# Patient Record
Sex: Female | Born: 1960 | Race: White | Hispanic: No | Marital: Married | Smoking: Never smoker
Health system: Southern US, Community
[De-identification: ages and names within clinical notes are randomized; demographics above are authoritative.]

## PROBLEM LIST (undated history)

## (undated) DIAGNOSIS — R51 Headache: Secondary | ICD-10-CM

## (undated) DIAGNOSIS — D649 Anemia, unspecified: Secondary | ICD-10-CM

## (undated) HISTORY — DX: Anemia, unspecified: D64.9

## (undated) HISTORY — PX: BREAST SURGERY: SHX581

## (undated) HISTORY — PX: WISDOM TOOTH EXTRACTION: SHX21

## (undated) HISTORY — PX: SHOULDER SURGERY: SHX246

## (undated) HISTORY — PX: DIAGNOSTIC LAPAROSCOPY: SUR761

## (undated) HISTORY — DX: Headache: R51

## (undated) HISTORY — PX: TONSILLECTOMY: SUR1361

---

## 2005-10-29 ENCOUNTER — Other Ambulatory Visit: Admission: RE | Admit: 2005-10-29 | Discharge: 2005-10-29 | Payer: Self-pay | Admitting: Obstetrics and Gynecology

## 2006-07-16 ENCOUNTER — Emergency Department (HOSPITAL_COMMUNITY): Admission: EM | Admit: 2006-07-16 | Discharge: 2006-07-16 | Payer: Self-pay | Admitting: Emergency Medicine

## 2006-07-21 ENCOUNTER — Ambulatory Visit: Payer: Self-pay | Admitting: Family Medicine

## 2006-08-11 ENCOUNTER — Ambulatory Visit: Payer: Self-pay | Admitting: Family Medicine

## 2006-09-25 ENCOUNTER — Ambulatory Visit: Payer: Self-pay | Admitting: Family Medicine

## 2006-10-08 ENCOUNTER — Ambulatory Visit: Payer: Self-pay | Admitting: Family Medicine

## 2006-11-21 ENCOUNTER — Other Ambulatory Visit: Admission: RE | Admit: 2006-11-21 | Discharge: 2006-11-21 | Payer: Self-pay | Admitting: Obstetrics and Gynecology

## 2006-12-03 DIAGNOSIS — R519 Headache, unspecified: Secondary | ICD-10-CM | POA: Insufficient documentation

## 2006-12-03 DIAGNOSIS — R51 Headache: Secondary | ICD-10-CM

## 2006-12-09 ENCOUNTER — Encounter: Admission: RE | Admit: 2006-12-09 | Discharge: 2006-12-09 | Payer: Self-pay | Admitting: Obstetrics and Gynecology

## 2007-01-09 ENCOUNTER — Ambulatory Visit: Payer: Self-pay | Admitting: Family Medicine

## 2007-01-09 DIAGNOSIS — D649 Anemia, unspecified: Secondary | ICD-10-CM

## 2007-01-09 DIAGNOSIS — H8110 Benign paroxysmal vertigo, unspecified ear: Secondary | ICD-10-CM | POA: Insufficient documentation

## 2007-12-07 ENCOUNTER — Ambulatory Visit (HOSPITAL_COMMUNITY): Admission: RE | Admit: 2007-12-07 | Discharge: 2007-12-07 | Payer: Self-pay | Admitting: Specialist

## 2007-12-23 ENCOUNTER — Encounter: Admission: RE | Admit: 2007-12-23 | Discharge: 2007-12-23 | Payer: Self-pay | Admitting: Family Medicine

## 2008-03-31 ENCOUNTER — Ambulatory Visit: Payer: Self-pay | Admitting: Family Medicine

## 2008-03-31 DIAGNOSIS — R5381 Other malaise: Secondary | ICD-10-CM

## 2008-03-31 DIAGNOSIS — R5383 Other fatigue: Secondary | ICD-10-CM

## 2008-03-31 DIAGNOSIS — R1013 Epigastric pain: Secondary | ICD-10-CM

## 2008-04-05 LAB — CONVERTED CEMR LAB
Albumin: 3.8 g/dL (ref 3.5–5.2)
BUN: 12 mg/dL (ref 6–23)
Calcium: 9.2 mg/dL (ref 8.4–10.5)
Cholesterol: 223 mg/dL (ref 0–200)
Direct LDL: 130 mg/dL
Eosinophils Absolute: 0.1 10*3/uL (ref 0.0–0.7)
Eosinophils Relative: 2.5 % (ref 0.0–5.0)
Ferritin: 4.3 ng/mL — ABNORMAL LOW (ref 10.0–291.0)
Folate: 11.8 ng/mL
GFR calc Af Amer: 114 mL/min
GFR calc non Af Amer: 95 mL/min
Glucose, Bld: 99 mg/dL (ref 70–99)
HCT: 33.7 % — ABNORMAL LOW (ref 36.0–46.0)
Hemoglobin: 11.1 g/dL — ABNORMAL LOW (ref 12.0–15.0)
MCV: 80 fL (ref 78.0–100.0)
Monocytes Absolute: 0.4 10*3/uL (ref 0.1–1.0)
Monocytes Relative: 10.8 % (ref 3.0–12.0)
Neutro Abs: 1.8 10*3/uL (ref 1.4–7.7)
Platelets: 320 10*3/uL (ref 150–400)
RDW: 14.2 % (ref 11.5–14.6)
Sodium: 141 meq/L (ref 135–145)
Total CHOL/HDL Ratio: 2.9
Total Protein: 6.9 g/dL (ref 6.0–8.3)
Triglycerides: 61 mg/dL (ref 0–149)
Vitamin B-12: 360 pg/mL (ref 211–911)

## 2009-01-23 ENCOUNTER — Encounter: Admission: RE | Admit: 2009-01-23 | Discharge: 2009-01-23 | Payer: Self-pay | Admitting: Obstetrics & Gynecology

## 2009-01-30 ENCOUNTER — Encounter: Admission: RE | Admit: 2009-01-30 | Discharge: 2009-01-30 | Payer: Self-pay | Admitting: Obstetrics & Gynecology

## 2009-02-21 ENCOUNTER — Ambulatory Visit: Payer: Self-pay | Admitting: Diagnostic Radiology

## 2009-02-21 ENCOUNTER — Emergency Department (HOSPITAL_BASED_OUTPATIENT_CLINIC_OR_DEPARTMENT_OTHER): Admission: EM | Admit: 2009-02-21 | Discharge: 2009-02-21 | Payer: Self-pay | Admitting: Emergency Medicine

## 2009-03-02 ENCOUNTER — Encounter (INDEPENDENT_AMBULATORY_CARE_PROVIDER_SITE_OTHER): Payer: Self-pay | Admitting: *Deleted

## 2009-04-15 HISTORY — PX: COLONOSCOPY: SHX174

## 2009-05-05 ENCOUNTER — Ambulatory Visit: Payer: Self-pay | Admitting: Family Medicine

## 2009-05-05 DIAGNOSIS — J019 Acute sinusitis, unspecified: Secondary | ICD-10-CM

## 2009-06-07 ENCOUNTER — Ambulatory Visit: Payer: Self-pay | Admitting: Family Medicine

## 2009-06-07 LAB — CONVERTED CEMR LAB
Nitrite: NEGATIVE
Protein, U semiquant: NEGATIVE
Urobilinogen, UA: 0.2
WBC Urine, dipstick: NEGATIVE

## 2009-06-08 LAB — CONVERTED CEMR LAB
ALT: 47 units/L — ABNORMAL HIGH (ref 0–35)
Alkaline Phosphatase: 57 units/L (ref 39–117)
Basophils Relative: 0.6 % (ref 0.0–3.0)
Bilirubin, Direct: 0 mg/dL (ref 0.0–0.3)
Chloride: 107 meq/L (ref 96–112)
Cholesterol: 201 mg/dL — ABNORMAL HIGH (ref 0–200)
Creatinine, Ser: 0.8 mg/dL (ref 0.4–1.2)
Direct LDL: 115.5 mg/dL
Eosinophils Relative: 2.8 % (ref 0.0–5.0)
Hemoglobin: 12.9 g/dL (ref 12.0–15.0)
MCV: 90.5 fL (ref 78.0–100.0)
Monocytes Absolute: 0.3 10*3/uL (ref 0.1–1.0)
Neutrophils Relative %: 47.7 % (ref 43.0–77.0)
Potassium: 4.6 meq/L (ref 3.5–5.1)
RBC: 4.38 M/uL (ref 3.87–5.11)
Sodium: 141 meq/L (ref 135–145)
Total CHOL/HDL Ratio: 2
Total Protein: 7 g/dL (ref 6.0–8.3)
VLDL: 14.6 mg/dL (ref 0.0–40.0)
WBC: 3.3 10*3/uL — ABNORMAL LOW (ref 4.5–10.5)

## 2009-06-09 ENCOUNTER — Telehealth (INDEPENDENT_AMBULATORY_CARE_PROVIDER_SITE_OTHER): Payer: Self-pay | Admitting: *Deleted

## 2009-06-13 ENCOUNTER — Ambulatory Visit: Payer: Self-pay | Admitting: Family Medicine

## 2009-06-19 ENCOUNTER — Encounter (INDEPENDENT_AMBULATORY_CARE_PROVIDER_SITE_OTHER): Payer: Self-pay | Admitting: *Deleted

## 2009-06-29 ENCOUNTER — Encounter (INDEPENDENT_AMBULATORY_CARE_PROVIDER_SITE_OTHER): Payer: Self-pay | Admitting: *Deleted

## 2009-07-03 ENCOUNTER — Ambulatory Visit: Payer: Self-pay | Admitting: Gastroenterology

## 2009-07-17 ENCOUNTER — Ambulatory Visit: Payer: Self-pay | Admitting: Gastroenterology

## 2009-08-01 ENCOUNTER — Encounter: Admission: RE | Admit: 2009-08-01 | Discharge: 2009-08-01 | Payer: Self-pay | Admitting: Obstetrics & Gynecology

## 2009-11-21 ENCOUNTER — Telehealth: Payer: Self-pay | Admitting: Family Medicine

## 2010-02-09 ENCOUNTER — Encounter: Admission: RE | Admit: 2010-02-09 | Discharge: 2010-02-09 | Payer: Self-pay | Admitting: Obstetrics and Gynecology

## 2010-05-06 ENCOUNTER — Encounter: Payer: Self-pay | Admitting: Obstetrics & Gynecology

## 2010-05-15 NOTE — Assessment & Plan Note (Signed)
Summary: head congestion//ccm   Vital Signs:  Patient profile:   50 year old female Weight:      139 pounds Temp:     97.7 degrees F oral BP sitting:   102 / 72  (left arm) Cuff size:   regular  Vitals Entered By: Alfred Levins, CMA (May 05, 2009 1:51 PM) CC: head congestion, fever, st, chills x2 wks   History of Present Illness: Here for one week of sinus pressure, HA, blowing green mucus from the nose, and a dry cough. No fever.   Current Medications (verified): 1)  Ferrous Fumarate 325 Mg Tabs (Ferrous Fumarate) .Marland Kitchen.. 1 By Mouth Two Times A Day  Allergies (verified): 1)  ! Sulfa  Past History:  Past Medical History: Reviewed history from 03/31/2008 and no changes required. Migraines Headache Anemia-NOS sees Dr. Ilda Mori  for GYN exams  Review of Systems  The patient denies anorexia, fever, weight loss, weight gain, vision loss, decreased hearing, hoarseness, chest pain, syncope, dyspnea on exertion, peripheral edema, hemoptysis, abdominal pain, melena, hematochezia, severe indigestion/heartburn, hematuria, incontinence, genital sores, muscle weakness, suspicious skin lesions, transient blindness, difficulty walking, depression, unusual weight change, abnormal bleeding, enlarged lymph nodes, angioedema, breast masses, and testicular masses.    Physical Exam  General:  Well-developed,well-nourished,in no acute distress; alert,appropriate and cooperative throughout examination Head:  Normocephalic and atraumatic without obvious abnormalities. No apparent alopecia or balding. Eyes:  No corneal or conjunctival inflammation noted. EOMI. Perrla. Funduscopic exam benign, without hemorrhages, exudates or papilledema. Vision grossly normal. Ears:  External ear exam shows no significant lesions or deformities.  Otoscopic examination reveals clear canals, tympanic membranes are intact bilaterally without bulging, retraction, inflammation or discharge. Hearing is grossly  normal bilaterally. Nose:  External nasal examination shows no deformity or inflammation. Nasal mucosa are pink and moist without lesions or exudates. Mouth:  Oral mucosa and oropharynx without lesions or exudates.  Teeth in good repair. Neck:  No deformities, masses, or tenderness noted. Lungs:  Normal respiratory effort, chest expands symmetrically. Lungs are clear to auscultation, no crackles or wheezes.   Impression & Recommendations:  Problem # 1:  ACUTE SINUSITIS, UNSPECIFIED (ICD-461.9)  Her updated medication list for this problem includes:    Zithromax Z-pak 250 Mg Tabs (Azithromycin) .Marland Kitchen... As directed  Complete Medication List: 1)  Ferrous Fumarate 325 Mg Tabs (Ferrous fumarate) .Marland Kitchen.. 1 by mouth two times a day 2)  Zithromax Z-pak 250 Mg Tabs (Azithromycin) .... As directed  Patient Instructions: 1)  Add Mucinex.  2)  Please schedule a follow-up appointment as needed .  Prescriptions: ZITHROMAX Z-PAK 250 MG TABS (AZITHROMYCIN) as directed  #1 x 0   Entered and Authorized by:   Nelwyn Salisbury MD   Signed by:   Nelwyn Salisbury MD on 05/05/2009   Method used:   Electronically to        [facility] [address] (retail)       [address]       [place], [place]  [address]       Ph: [phone] or [phone]       Fax: [phone]   RxID:   [id number]

## 2010-05-15 NOTE — Letter (Signed)
Summary: Moviprep Instructions  [facility]  [address]   [place], [place] [address]   Phone: [phone]  Fax: [phone]       Brittany Bartlett    1961/03/13    MRN: [id number]        Procedure Day /Date:07-17-09, Monday     Arrival Time: 10:00 a.m.      Procedure Time: 11:00 am.     Location of Procedure:                    x   [facility] (4th Floor)                        PREPARATION FOR COLONOSCOPY WITH MOVIPREP   Starting 5 days prior to your procedure 07-12-09 do not eat nuts, seeds, popcorn, corn, beans, peas,  salads, or any raw vegetables.  Do not take any fiber supplements (e.g. Metamucil, Citrucel, and Benefiber). THE DAY BEFORE YOUR PROCEDURE         DATE: 07-16-09    DAY: Sunday  1.  Drink clear liquids the entire day-NO SOLID FOOD  2.  Do not drink anything colored red or purple.  Avoid juices with pulp.  No orange juice.  3.  Drink at least 64 oz. (8 glasses) of fluid/clear liquids during the day to prevent dehydration and help the prep work efficiently.  CLEAR LIQUIDS INCLUDE: Water Jello Ice Popsicles Tea (sugar ok, no milk/cream) Powdered fruit flavored drinks Coffee (sugar ok, no milk/cream) Gatorade Juice: apple, white grape, white cranberry  Lemonade Clear bullion, consomm, broth Carbonated beverages (any kind) Strained chicken noodle soup Hard Candy                             4.  In the morning, mix first dose of MoviPrep solution:    Empty 1 Pouch A and 1 Pouch B into the disposable container    Add lukewarm drinking water to the top line of the container. Mix to dissolve    Refrigerate (mixed solution should be used within 24 hrs)  5.  Begin drinking the prep at 5:00 p.m. The MoviPrep container is divided by 4 marks.   Every 15 minutes drink the solution down to the next mark (approximately 8 oz) until the full liter is complete.   6.  Follow completed prep with 16 oz of clear liquid of your choice (Nothing  red or purple).  Continue to drink clear liquids until bedtime.  7.  Before going to bed, mix second dose of MoviPrep solution:    Empty 1 Pouch A and 1 Pouch B into the disposable container    Add lukewarm drinking water to the top line of the container. Mix to dissolve    Refrigerate  THE DAY OF YOUR PROCEDURE      DATE:  07-17-09  DAY: Monday  Beginning at 6:00 a.m. (5 hours before procedure):         1. Every 15 minutes, drink the solution down to the next mark (approx 8 oz) until the full liter is complete.  2. Follow completed prep with 16 oz. of clear liquid of your choice.    3. You may drink clear liquids until 9:00 a.m.  (2 HOURS BEFORE PROCEDURE).   MEDICATION INSTRUCTIONS  Unless otherwise instructed, you should take regular prescription medications with a small sip of water   as early as  possible the morning of your procedure.           OTHER INSTRUCTIONS  You will need a responsible adult at least 52 years of age to accompany you and drive you home.   This person must remain in the waiting room during your procedure.  Wear loose fitting clothing that is easily removed.  Leave jewelry and other valuables at home.  However, you may wish to bring a book to read or  an iPod/MP3 player to listen to music as you wait for your procedure to start.  Remove all body piercing jewelry and leave at home.  Total time from sign-in until discharge is approximately 2-3 hours.  You should go home directly after your procedure and rest.  You can resume normal activities the  day after your procedure.  The day of your procedure you should not:   Drive   Make legal decisions   Operate machinery   Drink alcohol   Return to work  You will receive specific instructions about eating, activities and medications before you leave.    The above instructions have been reviewed and explained to me by   Ezra Sites RN  July 03, 2009 1:52 PM    I fully understand and  can verbalize these instructions _____________________________ Date _________

## 2010-05-15 NOTE — Procedures (Signed)
Summary: Colonoscopy  Patient: [name] Note: All result statuses are Final unless otherwise noted.  Tests: (1) Colonoscopy (COL)   COL Colonoscopy           DONE     [facility]     [address]     [place], [place]  [address]           COLONOSCOPY PROCEDURE REPORT           PATIENT:  Brittany Bartlett, Brittany Bartlett  MR#:  [id number]     BIRTHDATE:  03/17/1961, 48 yrs. old  GENDER:  female     ENDOSCOPIST:  Vania Rea. Jarold Motto, MD, [facility]     REF. BY:  Tera Mater. Clent Ridges, M.D.     PROCEDURE DATE:  07/17/2009     PROCEDURE:  Average-risk screening colonoscopy     G0121     ASA CLASS:  Class I     INDICATIONS:  Iron Deficiency Anemia, Routine Risk Screening     MEDICATIONS:   Fentanyl 50 mcg IV, Versed 4 mg IV           DESCRIPTION OF PROCEDURE:   After the risks benefits and     alternatives of the procedure were thoroughly explained, informed     consent was obtained.  Digital rectal exam was performed and     revealed no abnormalities.   The LB CF-[id number] endoscope     was introduced through the anus and advanced to the cecum, which     was identified by both the appendix and ileocecal valve, without     limitations.  The quality of the prep was excellent, using     MoviPrep.  The instrument was then slowly withdrawn as the colon     was fully examined.     <<PROCEDUREIMAGES>>           FINDINGS:  No polyps or cancers were seen.  This was otherwise a     normal examination of the colon.   Retroflexed views in the rectum     revealed no abnormalities.    The scope was then withdrawn from     the patient and the procedure completed.           COMPLICATIONS:  None     ENDOSCOPIC IMPRESSION:     1) No polyps or cancers     2) Otherwise normal examination     RECOMMENDATIONS:     1) Continue current colorectal screening recommendations for     "routine risk" patients with a repeat colonoscopy in 10 years.     REPEAT EXAM:  No           ______________________________  Vania Rea. Jarold Motto, MD, [facility]           CC:           n.     eSIGNED:   Vania Rea. Naz Denunzio at 07/17/2009 12:07 PM           Nanetta Batty, [id number]  Note: An exclamation mark (!) indicates a result that was not dispersed into the flowsheet. Document Creation Date: 07/17/2009 12:08 PM _______________________________________________________________________  (1) Order result status: Final Collection or observation date-time: 07/17/2009 12:03 Requested date-time:  Receipt date-time:  Reported date-time:  Referring Physician:   Ordering Physician: Sheryn Bison [phone]) Specimen Source:  Source: Launa Grill Order Number: [id number] Lab site:   Appended Document: Colonoscopy    Clinical Lists Changes  Observations: Added new observation of COLONNXTDUE:  07/2019 (07/17/2009 12:34)     

## 2010-05-15 NOTE — Progress Notes (Signed)
Summary: ENT  Phone Note Call from Patient   Caller: Patient Call For: Nelwyn Salisbury MD Summary of Call: wants name of ENT you recommend for herself [phone] Initial call taken by: Raechel Ache, RN,  June 09, 2009 10:39 AM  Follow-up for Phone Call        try Dr. Serena Colonel at [facility] Follow-up by: Nelwyn Salisbury MD,  June 09, 2009 3:03 PM  Additional Follow-up for Phone Call Additional follow up Details #1::        Phone Call Completed Additional Follow-up by: Raechel Ache, RN,  June 09, 2009 3:48 PM

## 2010-05-15 NOTE — Progress Notes (Signed)
Summary: name ?  Phone Note Call from Patient Call back at Work Phone [phone]   Caller: Patient--live call Summary of Call: Need mame of a Gyn dr. Is it Waynard Reeds? Leave a message. Initial call taken by: Warnell Forester,  November 21, 2009 11:14 AM  Follow-up for Phone Call        yes I recommend Dr. Waynard Reeds Follow-up by: Nelwyn Salisbury MD,  November 21, 2009 12:00 PM  Additional Follow-up for Phone Call Additional follow up Details #1::        lmoam for the pt with info above. Additional Follow-up by: Warnell Forester,  November 21, 2009 1:54 PM

## 2010-05-15 NOTE — Assessment & Plan Note (Signed)
Summary: CPX/NO PAP/NJR   Vital Signs:  Patient profile:   50 year old female Height:      65.5 inches Weight:      138 pounds BMI:     22.70 Temp:     97.9 degrees F oral Pulse rate:   68 / minute BP sitting:   100 / 62  (left arm) Cuff size:   regular  Vitals Entered By: Alfred Levins, CMA (June 13, 2009 1:38 PM) CC: cpx, no pap   History of Present Illness: 50 yr old female  for cpx. She is doing well in general. She thinks she is starting menopause because her menses are very irregular  in the past 6 months. She is active and watches her diet.   Current Medications (verified): 1)  None  Allergies (verified): 1)  ! Sulfa  Past History:  Past Medical History: Reviewed history from 03/31/2008 and no changes required. Migraines Headache Anemia-NOS sees Dr. Ilda Mori  for GYN exams  Past Surgical History: right shoulder surgery to remove a bone spur 11-2007 per Dr. Shelle Iron  Family History: Reviewed history from 12/03/2006 and no changes required. Family Hsitory Headaches Family History High cholesterol Family History Hypertension Family History of Prostate CA 1st degree relative <50  Social History: Reviewed history from 12/03/2006 and no changes required. Occupation: Married Never Smoked Alcohol use-no Drug use-no Regular exercise-yes  Review of Systems  The patient denies anorexia, fever, weight loss, weight gain, vision loss, decreased hearing, hoarseness, chest pain, syncope, dyspnea on exertion, peripheral edema, prolonged cough, headaches, hemoptysis, abdominal pain, melena, hematochezia, severe indigestion/heartburn, hematuria, incontinence, genital sores, muscle weakness, suspicious skin lesions, transient blindness, difficulty walking, depression, unusual weight change, abnormal bleeding, enlarged lymph nodes, angioedema, breast masses, and testicular masses.    Physical Exam  General:  Well-developed,well-nourished,in no acute distress;  alert,appropriate and cooperative throughout examination Head:  Normocephalic and atraumatic without obvious abnormalities. No apparent alopecia or balding. Eyes:  No corneal or conjunctival inflammation noted. EOMI. Perrla. Funduscopic exam benign, without hemorrhages, exudates or papilledema. Vision grossly normal. Ears:  External ear exam shows no significant lesions or deformities.  Otoscopic examination reveals clear canals, tympanic membranes are intact bilaterally without bulging, retraction, inflammation or discharge. Hearing is grossly normal bilaterally. Nose:  External nasal examination shows no deformity or inflammation. Nasal mucosa are pink and moist without lesions or exudates. Mouth:  Oral mucosa and oropharynx without lesions or exudates.  Teeth in good repair. Neck:  No deformities, masses, or tenderness noted. Chest Wall:  No deformities, masses, or tenderness noted. Lungs:  Normal respiratory effort, chest expands symmetrically. Lungs are clear to auscultation, no crackles or wheezes. Heart:  Normal rate and regular rhythm. S1 and S2 normal without gallop, murmur, click, rub or other extra sounds. EKG normal. Abdomen:  Bowel sounds positive,abdomen soft and non-tender without masses, organomegaly or hernias noted. Msk:  No deformity or scoliosis noted of thoracic or lumbar spine.   Pulses:  R and L carotid,radial,femoral,dorsalis pedis and posterior tibial pulses are full and equal bilaterally Extremities:  No clubbing, cyanosis, edema, or deformity noted with normal full range of motion of all joints.   Neurologic:  No cranial nerve deficits noted. Station and gait are normal. Plantar reflexes are down-going bilaterally. DTRs are symmetrical throughout. Sensory, motor and coordinative functions appear intact. Skin:  Intact without suspicious lesions or rashes Cervical Nodes:  No lymphadenopathy noted Axillary Nodes:  No palpable lymphadenopathy Inguinal Nodes:  No significant  adenopathy Psych:  Cognition and judgment appear intact. Alert and cooperative with normal attention span and concentration. No apparent delusions, illusions, hallucinations   Impression & Recommendations:  Problem # 1:  HEALTH MAINTENANCE EXAM (ICD-V70.0)  Orders: EKG w/ Interpretation (93[address]) Gastroenterology Referral (GI)  Complete Medication List: 1)  Ferrous Sulfate 325 (65 Fe) Mg Tabs (Ferrous sulfate) .... Once daily  Patient Instructions: 1)  Schedule a colonoscopy/ sigmoidoscopy to help detect colon cancer.   Preventive Care Screening  Mammogram:    Date:  01/13/2009    Results:  normal   Pap Smear:    Date:  01/13/2009    Results:  normal

## 2010-05-15 NOTE — Miscellaneous (Signed)
Summary: [facility]  Clinical Lists Changes  Medications: Added new medication of MOVIPREP 100 GM  SOLR (PEG-KCL-NACL-NASULF-NA ASC-C) As per prep instructions. - Signed Rx of MOVIPREP 100 GM  SOLR (PEG-KCL-NACL-NASULF-NA ASC-C) As per prep instructions.;  #1 x 0;  Signed;  Entered by: Ezra Sites RN;  Authorized by: Mardella Layman MD [facility];  Method used: Electronically to [facility] [address] [place], [place], [place]  [address], Ph: [phone] or [phone], Fax: [phone] Allergies: Changed allergy or adverse reaction from SULFA to SULFA    Prescriptions: MOVIPREP 100 GM  SOLR (PEG-KCL-NACL-NASULF-NA ASC-C) As per prep instructions.  #1 x 0   Entered by:   Ezra Sites RN   Authorized by:   Mardella Layman MD [facility]   Signed by:   Ezra Sites RN on 07/03/2009   Method used:   Electronically to        [facility] [address] (retail)       [address]       [place], [place]  [address]       Ph: [phone] or [phone]       Fax: [phone]   RxID:   [id number]

## 2010-05-15 NOTE — Letter (Signed)
Summary: Previsit letter  [facility]  [address] [place], [place] [address]   Phone: [phone]  Fax: [phone]       06/19/2009 MRN: [id number]  Brittany Bartlett [address] [place], [place]  [address]  Dear [name],  Welcome to the Gastroenterology Division at [facility].    You are scheduled to see a nurse for your pre-procedure visit on 07/03/2009 at 1:00PM on the 3rd floor at [facility], [address]  We ask that you try to arrive at our office 15 minutes prior to your appointment time to allow for check-in.  Your nurse visit will consist of discussing your medical and surgical history, your immediate family medical history, and your medications.    Please bring a complete list of all your medications or, if you prefer, bring the medication bottles and we will list them.  We will need to be aware of both prescribed and over the counter drugs.  We will need to know exact dosage information as well.  If you are on blood thinners (Coumadin, Plavix, Aggrenox, Ticlid, etc.) please call our office today/prior to your appointment, as we need to consult with your physician about holding your medication.   Please be prepared to read and sign documents such as consent forms, a financial agreement, and acknowledgement forms.  If necessary, and with your consent, a friend or relative is welcome to sit-in on the nurse visit with you.  Please bring your insurance card so that we may make a copy of it.  If your insurance requires a referral to see a specialist, please bring your referral form from your primary care physician.  No co-pay is required for this nurse visit.     If you cannot keep your appointment, please call [phone] to cancel or reschedule prior to your appointment date.  This allows [place] the opportunity to schedule an appointment for another patient in need of care.    Thank you for choosing [facility] for your medical needs.   We appreciate the opportunity to care for you.  Please visit [place] at our website  to learn more about our practice.                     Sincerely.                                                                                                                   The Gastroenterology Division

## 2010-05-16 ENCOUNTER — Encounter: Payer: Self-pay | Admitting: Family Medicine

## 2010-05-17 ENCOUNTER — Encounter: Payer: Self-pay | Admitting: Family Medicine

## 2010-05-17 ENCOUNTER — Ambulatory Visit (INDEPENDENT_AMBULATORY_CARE_PROVIDER_SITE_OTHER): Payer: BC Managed Care – HMO | Admitting: Family Medicine

## 2010-05-17 VITALS — BP 110/80 | HR 84 | Temp 98.3°F | Wt 140.0 lb

## 2010-05-17 DIAGNOSIS — R002 Palpitations: Secondary | ICD-10-CM

## 2010-05-17 DIAGNOSIS — R238 Other skin changes: Secondary | ICD-10-CM

## 2010-05-17 DIAGNOSIS — E785 Hyperlipidemia, unspecified: Secondary | ICD-10-CM

## 2010-05-17 LAB — CBC WITH DIFFERENTIAL/PLATELET
Basophils Relative: 0.6 % (ref 0.0–3.0)
Eosinophils Relative: 0.6 % (ref 0.0–5.0)
HCT: 37.5 % (ref 36.0–46.0)
Lymphs Abs: 1.3 10*3/uL (ref 0.7–4.0)
MCV: 87.6 fl (ref 78.0–100.0)
Monocytes Absolute: 0.3 10*3/uL (ref 0.1–1.0)
RBC: 4.29 Mil/uL (ref 3.87–5.11)
WBC: 4.5 10*3/uL (ref 4.5–10.5)

## 2010-05-17 LAB — HEPATIC FUNCTION PANEL
ALT: 28 U/L (ref 0–35)
Alkaline Phosphatase: 61 U/L (ref 39–117)
Bilirubin, Direct: 0.1 mg/dL (ref 0.0–0.3)
Total Protein: 7.1 g/dL (ref 6.0–8.3)

## 2010-05-17 LAB — LIPID PANEL: VLDL: 12.8 mg/dL (ref 0.0–40.0)

## 2010-05-17 LAB — BASIC METABOLIC PANEL
BUN: 11 mg/dL (ref 6–23)
Calcium: 9.2 mg/dL (ref 8.4–10.5)
GFR: 86.36 mL/min (ref >60.00)
Glucose, Bld: 93 mg/dL (ref 70–99)

## 2010-05-17 LAB — LDL CHOLESTEROL, DIRECT: Direct LDL: 124.1 mg/dL

## 2010-05-17 LAB — PROTIME-INR: Prothrombin Time: 11.5 s (ref 10.2–12.4)

## 2010-05-17 NOTE — Progress Notes (Signed)
Addended by: Rossie Muskrat on: 05/17/2010 11:06 AM   Modules accepted: Orders

## 2010-05-17 NOTE — Patient Instructions (Addendum)
We will contact you about the Holter monitor

## 2010-05-17 NOTE — Progress Notes (Signed)
  Subjective:    Patient ID: Brittany Bartlett, female    DOB: [birth year], 50 y.o.   MRN: [id number]  HPI Here with several issues. First, she has noticed easy bruising on her skin for 6 months or so. She even relates how she had an open wound with bleeding on the right forearm 3 weeks ago after she simply brushed against a cardboard box. Also, she describes an odd event that occurs every time she exercises. She wears a heart monitor at the gym when she works out, and when walking on the treadmill she gets her heart rate up to 160 or so. Then when she changes to another piece of equipment, her HR suddenly drops to 60 or 70 with no slow down period. She feels nothing when this happens. Lastly she describes occasional episodes where she feels her heart beat hard twice ( "like a thump thump"), then she feels as if her BP drops suddenly and she feels a little weak. This is not related to exertion, and it lasts only a few seconds. No chest pain or SOB or sweats or nausea.    Review of Systems  Constitutional: Negative.   Respiratory: Negative.  Negative for chest tightness and shortness of breath.   Cardiovascular: Positive for palpitations. Negative for chest pain and leg swelling.  Gastrointestinal: Negative.        Objective:   Physical Exam  Constitutional: She appears well-developed and well-nourished.  Neck: Neck supple. No thyromegaly present.  Cardiovascular: Normal rate, regular rhythm, normal heart sounds and intact distal pulses.  Exam reveals no gallop and no friction rub.   No murmur heard. Lymphadenopathy:    She has no cervical adenopathy.  Skin: Skin is warm and dry. No rash noted. No erythema.  EKG is normal.         Assessment & Plan:  These palpitations seem to be benign, but they cause her a lot of anxiety. We will screen with labs today, and will set her up with a 48 hour Holter monitor. As far as the bruising goes, we will get labs for this as well.

## 2010-05-18 ENCOUNTER — Telehealth: Payer: Self-pay

## 2010-05-18 NOTE — Telephone Encounter (Signed)
Message copied by Madison Hickman on Fri May 18, 2010 11:57 AM ------      Message from: Dwaine Deter      Created: Fri May 18, 2010  8:22 AM       Normal except mildly high chol. Watch the diet

## 2010-05-29 ENCOUNTER — Encounter: Payer: Self-pay | Admitting: Cardiology

## 2010-05-29 ENCOUNTER — Encounter (INDEPENDENT_AMBULATORY_CARE_PROVIDER_SITE_OTHER): Payer: BC Managed Care – HMO

## 2010-05-29 DIAGNOSIS — R002 Palpitations: Secondary | ICD-10-CM

## 2010-06-11 ENCOUNTER — Telehealth: Payer: Self-pay | Admitting: Family Medicine

## 2010-06-11 NOTE — Telephone Encounter (Signed)
Pt would like a return call to discuss results of recent heart monitoring.... Pt can be reached at [phone].

## 2010-06-12 NOTE — Telephone Encounter (Deleted)
will you triage this for me?

## 2010-06-12 NOTE — Procedures (Signed)
Summary: Summary Report  Summary Report   Imported By: Erle Crocker 06/06/2010 16:48:48  _____________________________________________________________________  External Attachment:    Type:   Image     Comment:   External Document

## 2010-06-13 ENCOUNTER — Encounter: Payer: Self-pay | Admitting: Family Medicine

## 2010-06-13 ENCOUNTER — Ambulatory Visit (INDEPENDENT_AMBULATORY_CARE_PROVIDER_SITE_OTHER): Payer: BC Managed Care – HMO | Admitting: Family Medicine

## 2010-06-13 VITALS — BP 120/62 | HR 96 | Resp 12 | Wt 138.0 lb

## 2010-06-13 DIAGNOSIS — J329 Chronic sinusitis, unspecified: Secondary | ICD-10-CM

## 2010-06-13 MED ORDER — HYDROCODONE-HOMATROPINE 5-1.5 MG/5ML PO SYRP: 5.0000 mL | ORAL_SOLUTION | ORAL | Status: AC | PRN

## 2010-06-13 MED ORDER — AMOXICILLIN-POT CLAVULANATE 875-125 MG PO TABS: 1.0000 | ORAL_TABLET | Freq: Two times a day (BID) | ORAL | Status: AC

## 2010-06-13 NOTE — Progress Notes (Signed)
  Subjective:    Patient ID: Brittany Bartlett, female    DOB: March 02, 1961, 50 y.o.   MRN: [id number]  HPI Here for one week of sinus pressure, PND, ST, and coughing up yellow sputum. No fever.    Review of Systems  Constitutional: Negative.   HENT: Positive for congestion, postnasal drip and sinus pressure.   Eyes: Negative.   Respiratory: Positive for cough.        Objective:   Physical Exam  Constitutional: She appears well-developed and well-nourished. No distress.  HENT:  Head: Normocephalic and atraumatic.  Right Ear: External ear normal.  Left Ear: External ear normal.  Nose: Nose normal.  Mouth/Throat: Oropharynx is clear and moist. No oropharyngeal exudate.  Eyes: Conjunctivae are normal. Pupils are equal, round, and reactive to light.  Neck: No thyromegaly present.  Pulmonary/Chest: Effort normal and breath sounds normal. No respiratory distress. She has no wheezes. She has no rales. She exhibits no tenderness.  Lymphadenopathy:    She has no cervical adenopathy.          Assessment & Plan:  Follow up prn

## 2010-06-14 NOTE — Telephone Encounter (Signed)
Does not need to be triaged, pt came in for an appt

## 2010-07-13 ENCOUNTER — Encounter: Payer: Self-pay | Admitting: Family Medicine

## 2010-08-28 NOTE — Op Note (Signed)
NAME:  Brittany Bartlett, Brittany Bartlett NO.:  [id number]   MEDICAL RECORD NO.:  [id number]          PATIENT TYPE:  AMB   LOCATION:  DAY                          FACILITY:  [facility]   PHYSICIAN:  Jene Every, M.D.    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   POSTOPERATIVE DIAGNOSIS:  Adhesive capsulitis impingement syndrome of  the right shoulder, labral tear.   PROCEDURES PERFORMED:  1. Exam under anesthesia and manipulation under anesthesia.  2. Right shoulder arthroscopy, debridement glenolabral tear.  3. Subacromial decompression acromioplasty, debridement.   ANESTHESIA:  General.   ASSISTANT:  Roma Schanz, P.A.   BRIEF HISTORY AND INDICATION:  The patient is a 50 year old with  adhesive capsulitis refractory to conserve treatment including physical  therapy, injections and MRI indicating no tears.  He is indicated for  manipulation as well as evaluation for rotator cuff tear and  debridement.  Risks and benefits discussed including bleeding,  infection, fracture, no change in symptoms, worsening of symptoms, need  for open repair, etc.   DESCRIPTION OF PROCEDURE:  The patient supine beach-chair position,  after induction of adequate general anesthesia, 1 gram of Kefzol, the  shoulder was examined under anesthesia.  She had 45 degrees of  abduction.  She had neutral to 10 degrees of external rotation.  Internal rotation was limited as well, as was forward flexion to  approximately 90 degrees.  With gentle manipulative maneuver first in  the forward flexed position, then the abducted position, gently over a  period of time manipulated the shoulder, appreciated lysis of the  lesions.  This improved the forward flexion to within 10 degrees of the  contralateral side as well as abduction to  90 degrees.  Then blunt and  gentle stretching and external rotation securing the humerus proximally.  Then improved her external rotation to 30 degrees as well.  Next, post  manipulation radiographs demonstrated no fracture.  We proceeded with  arthroscopy.  The right shoulder and upper extremities were prepped and  draped in usual sterile fashion.  A surgical marker was utilized to  delineate the acromion and AC joint as well as the coracoid, a standard  posterolateral incision through the skin only was made.  Then we  advanced the arthroscopic cannula into the glenohumeral joint  penetrating the capsule atraumatically towards the coracoid.  Under  direct visualization, the anterior portal was then passed and just off  the anterolateral aspect of the acromion and advanced into the  glenohumeral joint beneath the biceps under direct visualization.  There  was significant hypertrophic scar tissue noted within the joint.  Small  anterior labral tear which was debrided with a 4.2 Cuda shaver.  The  humeral head and glenoid was unremarkable as was the biceps, there was  no SLAP lesion.  The rotator cuff tear noted from evaluation beneath the  rotator cuff.  Attachment of some thickening of the coracohumeral  ligament  was noted at insertion and divided with the shaver.  Next, we  redirected the camera in the subacromial space and used a lateral portal  through the skin only.  At the triangulation noted was exuberant bursa,  this was shaved with a 4.2 Cuda shaver.  This was extensive.  After a  full bursectomy, we evaluated the rotator cuff with internal-external  rotation and there was no evidence of tear.  Next, we inserted the  ArthroWand and morselized and divided the CA ligament as there was  anterior lateral impingement with a lateral sloping acromion and a small  spur.  The spur was shaved with a 4.2 Cuda shaver.  Following this,  there was significant improvement in the subacromial  space and with a  range, again no evidence of a tear.  Instrumentation was removed.  I  closed the portals with 4-0 nylon simple sutures, 25% Marcaine with  epinephrine was infiltrated in the joint.  Wound was dressed sterilely.  Placed in a sling, extubated without difficulty and transported to the  recovery room in satisfactory condition.   Patient tolerated the procedure well with no complications.  We had the  arm in the 70-30 position for the glenohumeral inspection and the 0-30  degrees position for the subacromial work.      Jene Every, M.D.  Electronically Signed     JB/MEDQ  D:  12/07/2007  T:  12/07/2007  Job:  [id number]

## 2010-08-31 NOTE — Assessment & Plan Note (Signed)
[facility] OFFICE NOTE   NAME:Brittany Bartlett, Brittany Bartlett                         MRN:          [id number]  DATE:07/21/2006                            DOB:          [birth year]    This is a 50 year old woman here to establish with our practice who is  also complaining of a upper respiratory infection. She is accompanied by  her husband who has the same respiratory type symptoms. One week ago she  began having sudden onset of diffuse body aches, low grade fever,  headache, sore throat and a dry cough. Now the body aches and fever are  better but over the last several days she has had increasing symptoms of  post nasal drainage, chest congestion and a hard cough productive of  yellow sputum. She saw an urgent care office about 4 days ago and was  diagnosed with a viral  infection. She was given an albuterol inhaler to  use as needed and also told to take Zyrtec daily and these have helped  somewhat.   PAST MEDICAL HISTORY:  Fairly unremarkable. She sees Dr. Ashley Royalty for  gynecology care, she has had one vaginal delivery. She has never had a  surgery. She also has migraine headaches and averages 1 or 2 of these a  month. They especially seem to come on around the time of her menstrual  cycle. These have never been diagnosed as migraines before. She  typically has to take something over-the-counter, lie down in a quiet  dark room and wait it out. She often gets nauseated with them although  she seldom's vomits.   ALLERGIES:  SULFA.   CURRENT MEDICATIONS:  Zyrtec 10 mg once a day.   HABITS:  She does not use tobacco or alcohol.   SOCIAL HISTORY:  She works as an International aid/development worker. She is married with 2  children, one of which is adopted.   FAMILY HISTORY:  Remarkable for migraine headaches in her mother and  father and also for high cholesterol and hypertension.   OBJECTIVE:  VITAL SIGNS:  Height 5 foot 6 inches, weight 133,  blood  pressure 110/84, pulse 92 and regular, temperature 99.4 degrees.  GENERAL:  She is breathing comfortably but coughs once in a while. She  is hoarse.  HEENT:  Eyes clear, ears clear, pharynx clear.  NECK:  Supple without lymphadenopathy or masses.  LUNGS:  Show some soft diffuse wheezes but no rales.   ASSESSMENT/PLAN:  1. Bronchitis. Z-Pak.  2. Migraine headaches. I gave her samples to try Imitrex 100 mg      tablets as needed and she can followup p.r.n.     Tera Mater. Clent Ridges, MD  Electronically Signed    SAF/MedQ  DD: 07/21/2006  DT: 07/21/2006  Job #: [id number]

## 2011-01-03 ENCOUNTER — Encounter: Payer: Self-pay | Admitting: Family Medicine

## 2011-01-03 ENCOUNTER — Ambulatory Visit (INDEPENDENT_AMBULATORY_CARE_PROVIDER_SITE_OTHER): Payer: BC Managed Care – HMO | Admitting: Family Medicine

## 2011-01-03 VITALS — BP 108/70 | HR 82 | Temp 98.5°F | Wt 135.0 lb

## 2011-01-03 DIAGNOSIS — J329 Chronic sinusitis, unspecified: Secondary | ICD-10-CM

## 2011-01-03 MED ORDER — AMOXICILLIN-POT CLAVULANATE 875-125 MG PO TABS: 1.0000 | ORAL_TABLET | Freq: Two times a day (BID) | ORAL | Status: AC

## 2011-01-03 NOTE — Progress Notes (Signed)
  Subjective:    Patient ID: Brittany Bartlett, female    DOB: 1960-12-12, 50 y.o.   MRN: [id number]  HPI Here for 4 days of sinus pressure, PND, ST, and a dry cough. No fever.    Review of Systems  Constitutional: Negative.   HENT: Positive for congestion, postnasal drip and sinus pressure.   Eyes: Negative.   Respiratory: Positive for cough.        Objective:   Physical Exam  Constitutional: She appears well-developed and well-nourished.  HENT:  Right Ear: External ear normal.  Left Ear: External ear normal.  Nose: Nose normal.  Mouth/Throat: Oropharynx is clear and moist. No oropharyngeal exudate.  Eyes: Conjunctivae are normal. Pupils are equal, round, and reactive to light.  Neck: Neck supple. No thyromegaly present.  Pulmonary/Chest: Effort normal and breath sounds normal.  Lymphadenopathy:    She has no cervical adenopathy.          Assessment & Plan:  Rest, fluids, Delsym prn

## 2011-01-09 ENCOUNTER — Other Ambulatory Visit: Payer: Self-pay | Admitting: Obstetrics and Gynecology

## 2011-01-09 DIAGNOSIS — Z1231 Encounter for screening mammogram for malignant neoplasm of breast: Secondary | ICD-10-CM

## 2011-02-11 ENCOUNTER — Ambulatory Visit
Admission: RE | Admit: 2011-02-11 | Discharge: 2011-02-11 | Disposition: A | Discharge status: Home / Self Care | Payer: BC Managed Care – HMO | Source: Ambulatory Visit | Attending: Obstetrics and Gynecology | Admitting: Obstetrics and Gynecology

## 2011-02-11 DIAGNOSIS — Z1231 Encounter for screening mammogram for malignant neoplasm of breast: Secondary | ICD-10-CM

## 2011-02-15 ENCOUNTER — Other Ambulatory Visit: Payer: Self-pay | Admitting: Obstetrics and Gynecology

## 2011-02-15 DIAGNOSIS — R928 Other abnormal and inconclusive findings on diagnostic imaging of breast: Secondary | ICD-10-CM

## 2011-03-01 ENCOUNTER — Ambulatory Visit
Admission: RE | Admit: 2011-03-01 | Discharge: 2011-03-01 | Disposition: A | Discharge status: Home / Self Care | Payer: BC Managed Care – HMO | Source: Ambulatory Visit | Attending: Obstetrics and Gynecology | Admitting: Obstetrics and Gynecology

## 2011-03-01 DIAGNOSIS — R928 Other abnormal and inconclusive findings on diagnostic imaging of breast: Secondary | ICD-10-CM

## 2012-01-20 ENCOUNTER — Other Ambulatory Visit: Payer: Self-pay | Admitting: Obstetrics and Gynecology

## 2012-01-20 DIAGNOSIS — Z1231 Encounter for screening mammogram for malignant neoplasm of breast: Secondary | ICD-10-CM

## 2012-02-14 ENCOUNTER — Ambulatory Visit
Admission: RE | Admit: 2012-02-14 | Discharge: 2012-02-14 | Disposition: A | Discharge status: Home / Self Care | Payer: BC Managed Care – PPO | Source: Ambulatory Visit | Attending: Obstetrics and Gynecology | Admitting: Obstetrics and Gynecology

## 2012-02-14 DIAGNOSIS — Z1231 Encounter for screening mammogram for malignant neoplasm of breast: Secondary | ICD-10-CM

## 2012-02-20 ENCOUNTER — Ambulatory Visit
Admission: RE | Admit: 2012-02-20 | Discharge: 2012-02-20 | Disposition: A | Discharge status: Home / Self Care | Payer: BC Managed Care – PPO | Source: Ambulatory Visit | Attending: Obstetrics and Gynecology | Admitting: Obstetrics and Gynecology

## 2012-02-20 ENCOUNTER — Other Ambulatory Visit: Payer: Self-pay | Admitting: Obstetrics and Gynecology

## 2012-02-20 DIAGNOSIS — R928 Other abnormal and inconclusive findings on diagnostic imaging of breast: Secondary | ICD-10-CM

## 2012-06-15 ENCOUNTER — Encounter (HOSPITAL_COMMUNITY): Payer: Self-pay | Admitting: Pharmacist

## 2012-06-15 ENCOUNTER — Encounter (HOSPITAL_COMMUNITY): Payer: Self-pay | Admitting: Obstetrics and Gynecology

## 2012-06-22 ENCOUNTER — Other Ambulatory Visit: Payer: Self-pay | Admitting: Obstetrics and Gynecology

## 2012-06-25 ENCOUNTER — Ambulatory Visit (HOSPITAL_COMMUNITY): Payer: BC Managed Care – PPO | Admitting: Anesthesiology

## 2012-06-25 ENCOUNTER — Encounter (HOSPITAL_COMMUNITY): Payer: Self-pay | Admitting: General Surgery

## 2012-06-25 ENCOUNTER — Encounter (HOSPITAL_COMMUNITY)
Admission: RE | Disposition: A | Discharge status: Home / Self Care | Payer: Self-pay | Source: Ambulatory Visit | Attending: Obstetrics and Gynecology

## 2012-06-25 ENCOUNTER — Encounter (HOSPITAL_COMMUNITY): Payer: Self-pay | Admitting: Anesthesiology

## 2012-06-25 ENCOUNTER — Ambulatory Visit (HOSPITAL_COMMUNITY)
Admission: RE | Admit: 2012-06-25 | Discharge: 2012-06-25 | Disposition: A | Discharge status: Home / Self Care | Payer: BC Managed Care – PPO | Source: Ambulatory Visit | Attending: Obstetrics and Gynecology | Admitting: Obstetrics and Gynecology

## 2012-06-25 DIAGNOSIS — N950 Postmenopausal bleeding: Secondary | ICD-10-CM | POA: Insufficient documentation

## 2012-06-25 DIAGNOSIS — D250 Submucous leiomyoma of uterus: Secondary | ICD-10-CM | POA: Insufficient documentation

## 2012-06-25 HISTORY — PX: DILATATION & CURRETTAGE/HYSTEROSCOPY WITH RESECTOCOPE: SHX5572

## 2012-06-25 LAB — CBC
HCT: 44 % (ref 36.0–46.0)
MCH: 30.6 pg (ref 26.0–34.0)
MCV: 89.8 fL (ref 78.0–100.0)
RBC: 4.9 MIL/uL (ref 3.87–5.11)
WBC: 3.3 10*3/uL — ABNORMAL LOW (ref 4.0–10.5)

## 2012-06-25 SURGERY — DILATATION & CURETTAGE/HYSTEROSCOPY WITH RESECTOCOPE
Anesthesia: General | Wound class: Clean Contaminated

## 2012-06-25 MED ORDER — LACTATED RINGERS IV SOLN
INTRAVENOUS | Status: DC
  Administered 2012-06-25: 11:00:00 via INTRAVENOUS

## 2012-06-25 MED ORDER — PROPOFOL 10 MG/ML IV EMUL
INTRAVENOUS | Status: DC | PRN
  Administered 2012-06-25: 160 mg via INTRAVENOUS

## 2012-06-25 MED ORDER — FENTANYL CITRATE 0.05 MG/ML IJ SOLN
INTRAMUSCULAR | Status: AC
  Filled 2012-06-25: qty 2

## 2012-06-25 MED ORDER — MEPERIDINE HCL 25 MG/ML IJ SOLN: 6.2500 mg | INTRAMUSCULAR | Status: DC | PRN

## 2012-06-25 MED ORDER — HYDROCODONE-ACETAMINOPHEN 5-500 MG PO TABS: 1.0000 | ORAL_TABLET | ORAL | Status: DC | PRN

## 2012-06-25 MED ORDER — PHENYLEPHRINE HCL 10 MG/ML IJ SOLN
INTRAMUSCULAR | Status: DC | PRN
  Administered 2012-06-25: 80 ug via INTRAVENOUS
  Administered 2012-06-25 (×2): 40 ug via INTRAVENOUS
  Administered 2012-06-25: 80 ug via INTRAVENOUS

## 2012-06-25 MED ORDER — KETOROLAC TROMETHAMINE 30 MG/ML IJ SOLN
INTRAMUSCULAR | Status: AC
  Filled 2012-06-25: qty 1

## 2012-06-25 MED ORDER — ONDANSETRON HCL 4 MG/2ML IJ SOLN
INTRAMUSCULAR | Status: AC
  Filled 2012-06-25: qty 2

## 2012-06-25 MED ORDER — ONDANSETRON HCL 4 MG/2ML IJ SOLN
INTRAMUSCULAR | Status: DC | PRN
  Administered 2012-06-25: 4 mg via INTRAVENOUS

## 2012-06-25 MED ORDER — LIDOCAINE HCL 1 % IJ SOLN
INTRAMUSCULAR | Status: DC | PRN
  Administered 2012-06-25: 10 mL

## 2012-06-25 MED ORDER — GLYCINE 1.5 % IR SOLN
Status: DC | PRN
  Administered 2012-06-25: 3000 mL

## 2012-06-25 MED ORDER — FENTANYL CITRATE 0.05 MG/ML IJ SOLN
INTRAMUSCULAR | Status: DC | PRN
  Administered 2012-06-25: 100 ug via INTRAVENOUS

## 2012-06-25 MED ORDER — MIDAZOLAM HCL 5 MG/5ML IJ SOLN
INTRAMUSCULAR | Status: DC | PRN
  Administered 2012-06-25: 2 mg via INTRAVENOUS

## 2012-06-25 MED ORDER — FENTANYL CITRATE 0.05 MG/ML IJ SOLN
25.0000 ug | INTRAMUSCULAR | Status: DC | PRN
  Administered 2012-06-25 (×2): 25 ug via INTRAVENOUS

## 2012-06-25 MED ORDER — IBUPROFEN 600 MG PO TABS: 600.0000 mg | ORAL_TABLET | Freq: Four times a day (QID) | ORAL | Status: DC | PRN

## 2012-06-25 MED ORDER — DEXAMETHASONE SODIUM PHOSPHATE 10 MG/ML IJ SOLN
INTRAMUSCULAR | Status: DC | PRN
  Administered 2012-06-25: 10 mg via INTRAVENOUS

## 2012-06-25 MED ORDER — PHENYLEPHRINE 40 MCG/ML (10ML) SYRINGE FOR IV PUSH (FOR BLOOD PRESSURE SUPPORT)
PREFILLED_SYRINGE | INTRAVENOUS | Status: AC
  Filled 2012-06-25: qty 10

## 2012-06-25 MED ORDER — KETOROLAC TROMETHAMINE 30 MG/ML IJ SOLN
INTRAMUSCULAR | Status: DC | PRN
  Administered 2012-06-25: 30 mg via INTRAVENOUS

## 2012-06-25 MED ORDER — LIDOCAINE HCL (CARDIAC) 20 MG/ML IV SOLN
INTRAVENOUS | Status: AC
  Filled 2012-06-25: qty 5

## 2012-06-25 MED ORDER — LIDOCAINE HCL (CARDIAC) 20 MG/ML IV SOLN
INTRAVENOUS | Status: DC | PRN
  Administered 2012-06-25: 50 mg via INTRAVENOUS

## 2012-06-25 MED ORDER — PROPOFOL 10 MG/ML IV EMUL
INTRAVENOUS | Status: AC
  Filled 2012-06-25: qty 20

## 2012-06-25 MED ORDER — METOCLOPRAMIDE HCL 5 MG/ML IJ SOLN: 10.0000 mg | Freq: Once | INTRAMUSCULAR | Status: DC | PRN

## 2012-06-25 MED ORDER — DEXAMETHASONE SODIUM PHOSPHATE 10 MG/ML IJ SOLN
INTRAMUSCULAR | Status: AC
  Filled 2012-06-25: qty 1

## 2012-06-25 MED ORDER — MIDAZOLAM HCL 2 MG/2ML IJ SOLN
INTRAMUSCULAR | Status: AC
  Filled 2012-06-25: qty 2

## 2012-06-25 SURGICAL SUPPLY — 15 items
ABLATOR ENDOMETRIAL BIPOLAR (ABLATOR) IMPLANT
CANISTER SUCTION 2500CC (MISCELLANEOUS) ×2 IMPLANT
CATH ROBINSON RED A/P 16FR (CATHETERS) ×2 IMPLANT
CLOTH BEACON ORANGE TIMEOUT ST (SAFETY) ×2 IMPLANT
CONTAINER PREFILL 10% NBF 60ML (FORM) ×4 IMPLANT
DILATOR CANAL MILEX (MISCELLANEOUS) IMPLANT
DRESSING TELFA 8X3 (GAUZE/BANDAGES/DRESSINGS) ×2 IMPLANT
ELECT REM PT RETURN 9FT ADLT (ELECTROSURGICAL)
ELECTRODE REM PT RTRN 9FT ADLT (ELECTROSURGICAL) IMPLANT
GLOVE BIO SURGEON STRL SZ7 (GLOVE) ×2 IMPLANT
GOWN STRL REIN XL XLG (GOWN DISPOSABLE) ×4 IMPLANT
PACK HYSTEROSCOPY LF (CUSTOM PROCEDURE TRAY) ×2 IMPLANT
PAD OB MATERNITY 4.3X12.25 (PERSONAL CARE ITEMS) ×2 IMPLANT
TOWEL OR 17X24 6PK STRL BLUE (TOWEL DISPOSABLE) ×4 IMPLANT
WATER STERILE IRR 1000ML POUR (IV SOLUTION) ×2 IMPLANT

## 2012-06-25 NOTE — H&P (Signed)
Brittany Bartlett is an 52 y.o. female hysteroscopy D&C for postmenopausal bleeding  52 year old Caucasian female presents for evaluation of postmenopausal bleeding. The patient had a year-long history of no menses, then in November 2013 she experienced what she called a normal menstrual cycle. An ultrasound in the office was performed and showed thickened endometrium. An endometrial biopsy was benign, and a sonohysterogram showed evidence of an endometrial polyp. Given these findings the decision was made to proceed with surgical evaluation. She presents today for hysteroscopy D&C. The risks benefits and alternatives of this procedure have been discussed with the patient and her husband at length. She wishes to proceed.     Patient's last menstrual period was 02/11/2011.    Past Medical History  Diagnosis Date  . Headache   . Migraine   . SVD (spontaneous vaginal delivery)     x 1  . Anemia     history    Past Surgical History  Procedure Laterality Date  . Shoulder surgery      to remove bone spur 11-2007 Dr Shelle Iron  . Diagnostic laparoscopy      x 2  . Tonsillectomy    . Wisdom tooth extraction    . Breast surgery      marker in left breast - watching an area    Family History  Problem Relation Age of Onset  . Migraines      family hx  . Hyperlipidemia      family hx  . Hypertension      family hx  . Prostate cancer      family hx 1st degree <50  . Heart disease Father   . Leukemia Paternal Uncle     Social History:  reports that she has never smoked. She has never used smokeless tobacco. She reports that  drinks alcohol. She reports that she does not use illicit drugs.  Allergies:  Allergies  Allergen Reactions  . Sulfonamide Derivatives Hives and Itching    Prescriptions prior to admission  Medication Sig Dispense Refill  . OVER THE COUNTER MEDICATION Take 2 capsules by mouth daily. Cellular Vitality Complex      . OVER THE COUNTER MEDICATION Take 2 capsules by  mouth daily. Food Nutrient Complex      . OVER THE COUNTER MEDICATION Take 2 capsules by mouth daily. Essential Oil Omega Complex      . OVER THE COUNTER MEDICATION Take 1 capsule by mouth 2 (two) times daily. Utrophin PMG      . Probiotic Product (PROBIOTIC DAILY PO) Take 1-2 capsules by mouth daily. Takes once or twice daily        ROS  Blood pressure 105/69, pulse 76, temperature 98 F (36.7 C), temperature source Oral, resp. rate 18, height 5\' 6"  (1.676 m), weight 60.328 kg (133 lb), last menstrual period 02/11/2011, SpO2 100.00%. Physical Exam  Results for orders placed during the hospital encounter of 06/25/12 (from the past 24 hour(s))  CBC     Status: Abnormal   Collection Time    06/25/12 10:36 AM      Result Value Range   WBC 3.3 (*) 4.0 - 10.5 K/uL   RBC 4.90  3.87 - 5.11 MIL/uL   Hemoglobin 15.0  12.0 - 15.0 g/dL   HCT 11.9  14.7 - 82.9 %   MCV 89.8  78.0 - 100.0 fL   MCH 30.6  26.0 - 34.0 pg   MCHC 34.1  30.0 - 36.0 g/dL   RDW 12.7  11.5 - 15.5 %   Platelets 229  150 - 400 K/uL    No results found.  Assessment/Plan: 1) Proceed with hysteroscopy D&C. Informed consent has been obtained. Did discuss with the patient that if he believed to be an endometrial polyp is actually a fibroid than a resectoscope will be required. This may cause the case to take slightly longer than if the lesion were a polyp. 2) SCDs to the operating room  ROSS,KENDRA H. 06/25/2012, 11:26 AM

## 2012-06-25 NOTE — Op Note (Signed)
Pre-Operative Diagnosis: 1) Post-menopausal bleeding 2) Submucosal mass, polyp vs fibroid Postoperative Diagnosis: 1) Post-menopausal bleeding 2) Anterior submucosal fibroid Procedure: Hysteroscopy, Resection of submucosal fibroid Surgeon: Dr. Waynard Reeds Assistant: None Operative Findings: Uterus sounded to 8 cm. Approximately 2-3 cm anterior patient's right submucosal fibroid noted. Normal tubal ostia noted on the patient's left. Due to the location of the fibroid the right tubal ostia was not well visualized. 1+ cervical descensus was noted on exam. Specimen: Fragments of submucosal fibroid EBL: Minimal  Procedure: Ms. Lowrimore is a 52 year old Caucasian female who presents for definitive surgical evaluation of menopausal bleeding. A sonohysterogram in the office demonstrated either a submucosal polyp or mucus uterine fibroid. She presents today for hysteroscopy, D&C, and either polypectomy or resection of submucosal fibroid. Following the appropriate informed consent the patient was brought to the operating room. Gen. Anesthesia was administered and the patient was placed in anatomy position in [place] stirrups. The perineum and vagina were prepped and draped in the normal sterile fashion. Speculum was placed in the vagina and a single-tooth tenaculum was placed on anterior lip of the cervix. The uterus was sounded to 8 cm. The cervix was serially dilated with Hank dilators and the hysteroscope was introduced transcervically. The endometrial cavity was visualized the left tubal ostia was visualized and normal, the right tubal ostia was not visualized due to the location of the submucosal fibroid. And a pedunculated submucosal fibroid was noted anteriorly. The hysteroscope was removed, the resectoscope was assembled, and the hysteroscope was reintroduced. With several passes the resectoscope was used to remove the submucosal fibroid and direct visualization. The submucosal fibroid was noted to be completely  resected. This completed the procedure. All sponge lap needle counts are correct x2, the patient was brought to the recovery room in stable condition following the procedure.

## 2012-06-25 NOTE — Anesthesia Preprocedure Evaluation (Addendum)
Anesthesia Evaluation    Airway Mallampati: II TM Distance: >3 FB Neck ROM: Full    Dental no notable dental hx. (+) Teeth Intact   Pulmonary neg pulmonary ROS,  breath sounds clear to auscultation  Pulmonary exam normal       Cardiovascular negative cardio ROS  Rhythm:Regular Rate:Normal     Neuro/Psych  Headaches, Vertigo  negative psych ROS   GI/Hepatic negative GI ROS, Neg liver ROS,   Endo/Other  negative endocrine ROS  Renal/GU negative Renal ROS  negative genitourinary   Musculoskeletal negative musculoskeletal ROS (+)   Abdominal   Peds  Hematology negative hematology ROS (+) Blood dyscrasia, anemia ,   Anesthesia Other Findings   Reproductive/Obstetrics Cervical polyp  PMB                           Anesthesia Physical Anesthesia Plan  ASA: II  Anesthesia Plan: General   Post-op Pain Management:    Induction: Intravenous  Airway Management Planned: LMA  Additional Equipment:   Intra-op Plan:   Post-operative Plan: Extubation in OR  Informed Consent: I have reviewed the patients History and Physical, chart, labs and discussed the procedure including the risks, benefits and alternatives for the proposed anesthesia with the patient or authorized representative who has indicated his/her understanding and acceptance.   Dental advisory given  Plan Discussed with: CRNA, Anesthesiologist and Surgeon  Anesthesia Plan Comments:         Anesthesia Quick Evaluation

## 2012-06-25 NOTE — Transfer of Care (Signed)
Immediate Anesthesia Transfer of Care Note  Patient: Brittany Bartlett  Procedure(s) Performed: Procedure(s): DILATATION & CURETTAGE/HYSTEROSCOPY WITH RESECTOCOPE (N/A)  Patient Location: PACU  Anesthesia Type:General  Level of Consciousness: awake  Airway & Oxygen Therapy: Patient Spontanous Breathing  Post-op Assessment: Report given to PACU RN  Post vital signs: stable  Filed Vitals:   06/25/12 1048  BP: 105/69  Pulse: 76  Temp: 36.7 C  Resp: 18    Complications: No apparent anesthesia complications

## 2012-06-26 ENCOUNTER — Encounter (HOSPITAL_COMMUNITY): Payer: Self-pay | Admitting: Obstetrics and Gynecology

## 2012-06-26 NOTE — Anesthesia Postprocedure Evaluation (Signed)
  Anesthesia Post-op Note  Patient: Mekala Winger  Procedure(s) Performed: Procedure(s): DILATATION & CURETTAGE/HYSTEROSCOPY WITH RESECTOCOPE (N/A)  Patient Location: PACU  Anesthesia Type:General  Level of Consciousness: awake, alert  and oriented  Airway and Oxygen Therapy: Patient Spontanous Breathing  Post-op Pain: mild  Post-op Assessment: Post-op Vital signs reviewed, Patient's Cardiovascular Status Stable, Respiratory Function Stable, Patent Airway, No signs of Nausea or vomiting and Pain level controlled  Post-op Vital Signs: Reviewed and stable  Complications: No apparent anesthesia complications

## 2013-01-20 ENCOUNTER — Other Ambulatory Visit: Payer: Self-pay

## 2013-01-20 DIAGNOSIS — Z1231 Encounter for screening mammogram for malignant neoplasm of breast: Secondary | ICD-10-CM

## 2013-02-22 ENCOUNTER — Ambulatory Visit
Admission: RE | Admit: 2013-02-22 | Discharge: 2013-02-22 | Disposition: A | Discharge status: Home / Self Care | Payer: BC Managed Care – PPO | Source: Ambulatory Visit

## 2013-02-22 DIAGNOSIS — Z1231 Encounter for screening mammogram for malignant neoplasm of breast: Secondary | ICD-10-CM

## 2013-08-23 ENCOUNTER — Other Ambulatory Visit: Payer: Self-pay | Admitting: Orthopedic Surgery

## 2013-08-23 DIAGNOSIS — M25512 Pain in left shoulder: Secondary | ICD-10-CM

## 2013-08-25 ENCOUNTER — Ambulatory Visit
Admission: RE | Admit: 2013-08-25 | Discharge: 2013-08-25 | Disposition: A | Discharge status: Home / Self Care | Payer: BC Managed Care – PPO | Source: Ambulatory Visit | Attending: Orthopedic Surgery | Admitting: Orthopedic Surgery

## 2013-08-25 DIAGNOSIS — M25512 Pain in left shoulder: Secondary | ICD-10-CM

## 2013-09-07 ENCOUNTER — Encounter: Payer: Self-pay | Admitting: Family Medicine

## 2014-01-28 ENCOUNTER — Other Ambulatory Visit: Payer: Self-pay

## 2014-01-28 DIAGNOSIS — Z1231 Encounter for screening mammogram for malignant neoplasm of breast: Secondary | ICD-10-CM

## 2014-02-23 ENCOUNTER — Ambulatory Visit
Admission: RE | Admit: 2014-02-23 | Discharge: 2014-02-23 | Disposition: A | Discharge status: Home / Self Care | Payer: BC Managed Care – PPO | Source: Ambulatory Visit

## 2014-02-23 DIAGNOSIS — Z1231 Encounter for screening mammogram for malignant neoplasm of breast: Secondary | ICD-10-CM

## 2014-03-03 ENCOUNTER — Telehealth: Payer: Self-pay | Admitting: Family Medicine

## 2014-03-03 ENCOUNTER — Emergency Department (INDEPENDENT_AMBULATORY_CARE_PROVIDER_SITE_OTHER)
Admission: EM | Admit: 2014-03-03 | Discharge: 2014-03-03 | Disposition: A | Discharge status: Home / Self Care | Payer: BC Managed Care – PPO | Source: Home / Self Care | Attending: Family Medicine | Admitting: Family Medicine

## 2014-03-03 ENCOUNTER — Encounter: Payer: Self-pay | Admitting: Emergency Medicine

## 2014-03-03 DIAGNOSIS — L02214 Cutaneous abscess of groin: Secondary | ICD-10-CM

## 2014-03-03 MED ORDER — DOXYCYCLINE HYCLATE 100 MG PO CAPS: 100.0000 mg | ORAL_CAPSULE | Freq: Two times a day (BID) | ORAL | Status: DC

## 2014-03-03 NOTE — ED Notes (Signed)
Abscess Right side in the bend of top of the thigh, 2 days, patient states this problem is recurring

## 2014-03-03 NOTE — Telephone Encounter (Signed)
Pt would like to be re-est last ov 12-2010. Can I sch?

## 2014-03-03 NOTE — ED Provider Notes (Signed)
CSN: [id number]     Arrival date & time 03/03/14  1536 History   First MD Initiated Contact with Patient 03/03/14 1607     Chief Complaint  Patient presents with  . Abscess      HPI Comments: Patient complains of one year history of recurring absesses in groin area (both sides), as many as 15 during the past year.  They always drain spontaneously. She has had a recurrent right groin abscess for two days, now with surrounding erythema.  She feels well otherwise.  No fevers, chills, and sweats   Patient is a 53 y.o. female presenting with abscess. The history is provided by the patient.  Abscess Abscess location: right groin. Size:  1cm by 3cm Abscess quality: fluctuance, painful, redness and warmth   Abscess quality: not draining, no induration, no itching and not weeping   Red streaking: no   Duration:  2 days Progression:  Worsening Pain details:    Quality:  Throbbing and pressure   Severity:  Mild   Duration:  2 days   Timing:  Constant   Progression:  Worsening Chronicity:  Chronic Relieved by:  Nothing Exacerbated by: movement. Ineffective treatments:  Warm compresses Associated symptoms: no anorexia, no fatigue, no fever, no nausea and no vomiting   Risk factors: prior abscess   Risk factors: no hx of MRSA     Past Medical History  Diagnosis Date  . Headache(784.0)   . Migraine   . SVD (spontaneous vaginal delivery)     x 1  . Anemia     history   Past Surgical History  Procedure Laterality Date  . Shoulder surgery      to remove bone spur 11-2007 Dr Tonita Cong  . Diagnostic laparoscopy      x 2  . Tonsillectomy    . Wisdom tooth extraction    . Breast surgery      marker in left breast - watching an area  . Dilatation & currettage/hysteroscopy with resectocope N/A 06/25/2012    Procedure: DILATATION & CURETTAGE/HYSTEROSCOPY WITH RESECTOCOPE;  Surgeon: Farrel Gobble. Harrington Challenger, MD;  Location: [facility];  Service: Gynecology;  Laterality: N/A;   Family History  Problem  Relation Age of Onset  . Migraines      family hx  . Hyperlipidemia      family hx  . Hypertension      family hx  . Prostate cancer      family hx 1st degree <50  . Heart disease Father   . Leukemia Paternal Uncle    History  Substance Use Topics  . Smoking status: Never Smoker   . Smokeless tobacco: Never Used  . Alcohol Use: Yes     Comment: occasional   OB History    No data available     Review of Systems  Constitutional: Negative for fever and fatigue.  Gastrointestinal: Negative for nausea, vomiting and anorexia.  All other systems reviewed and are negative.   Allergies  Sulfonamide derivatives  Home Medications   Prior to Admission medications   Medication Sig Start Date End Date Taking? Authorizing Provider  doxycycline (VIBRAMYCIN) 100 MG capsule Take 1 capsule (100 mg total) by mouth 2 (two) times daily. 03/03/14   Kandra Nicolas, MD  HYDROcodone-acetaminophen (VICODIN) 5-500 MG per tablet Take 1 tablet by mouth every 4 (four) hours as needed for pain. 06/25/12   Kendra H. Harrington Challenger, MD  ibuprofen (ADVIL,MOTRIN) 600 MG tablet Take 1 tablet (600 mg total) by mouth  every 6 (six) hours as needed for pain. 06/25/12   Kendra H. Harrington Challenger, MD  OVER THE COUNTER MEDICATION Take 2 capsules by mouth daily. Cellular Vitality Complex    Historical Provider, MD  OVER THE COUNTER MEDICATION Take 2 capsules by mouth daily. Food Nutrient Complex    Historical Provider, MD  OVER THE COUNTER MEDICATION Take 2 capsules by mouth daily. Essential Oil Omega Complex    Historical Provider, MD  OVER THE COUNTER MEDICATION Take 1 capsule by mouth 2 (two) times daily. Utrophin PMG    Historical Provider, MD  Probiotic Product (PROBIOTIC DAILY PO) Take 1-2 capsules by mouth daily. Takes once or twice daily    Historical Provider, MD   BP 111/77 mmHg  Pulse 84  Temp(Src) 98.4 F (36.9 C)  Ht 5\' 6"  (1.676 m)  Wt 141 lb (63.957 kg)  BMI 22.77 kg/m2  SpO2 98%  LMP 02/11/2011 Physical Exam    Constitutional: She is oriented to person, place, and time. She appears well-developed and well-nourished. No distress.  HENT:  Head: Normocephalic.  Eyes: Pupils are equal, round, and reactive to light.  Genitourinary:     1cm by 3cm fluctuant lesion with surrounding erythema and tenderness as noted on diagram.  Margins of erythema marked with skin marker.   Neurological: She is alert and oriented to person, place, and time.  Nursing note and vitals reviewed.   ED Course  Procedures  Incise and drain cyst/abscess Risks and benefits of procedure explained to patient and verbal consent obtained.  Using sterile technique and local anesthesia with 1% lidocaine with epinephrine, cleansed affected area with Betadine and saline. Identified the most fluctuant area of lesion and incised with #11 blade.  Expressed minimal blood and purulent material.  Inserted short length Iodoform gauze packing to maintain wound patent.  Bandage applied.  Patient tolerated well     Labs Reviewed  WOUND CULTURE         MDM   1. Abscess of right groin    Wound culture pending. Begin doxycycline for staph coverage. Leave bandage in place until follow-up visit tomorrow.  May take Tylenol or Ibuprofen for pain if needed. Return tomorrow for follow-up and packing removal. If symptoms become significantly worse during the night or over the weekend, proceed to the local emergency room.     Kandra Nicolas, MD 03/03/14 [phone]

## 2014-03-03 NOTE — Discharge Instructions (Signed)
Leave bandage in place until follow-up visit tomorrow.  May take Tylenol or Ibuprofen for pain if needed.   Abscess An abscess is an infected area that contains a collection of pus and debris.It can occur in almost any part of the body. An abscess is also known as a furuncle or boil. CAUSES  An abscess occurs when tissue gets infected. This can occur from blockage of oil or sweat glands, infection of hair follicles, or a minor injury to the skin. As the body tries to fight the infection, pus collects in the area and creates pressure under the skin. This pressure causes pain. People with weakened immune systems have difficulty fighting infections and get certain abscesses more often.  SYMPTOMS Usually an abscess develops on the skin and becomes a painful mass that is red, warm, and tender. If the abscess forms under the skin, you may feel a moveable soft area under the skin. Some abscesses break open (rupture) on their own, but most will continue to get worse without care. The infection can spread deeper into the body and eventually into the bloodstream, causing you to feel ill.  DIAGNOSIS  Your caregiver will take your medical history and perform a physical exam. A sample of fluid may also be taken from the abscess to determine what is causing your infection. TREATMENT  Your caregiver may prescribe antibiotic medicines to fight the infection. However, taking antibiotics alone usually does not cure an abscess. Your caregiver may need to make a small cut (incision) in the abscess to drain the pus. In some cases, gauze is packed into the abscess to reduce pain and to continue draining the area. HOME CARE INSTRUCTIONS   Only take over-the-counter or prescription medicines for pain, discomfort, or fever as directed by your caregiver.  If you were prescribed antibiotics, take them as directed. Finish them even if you start to feel better.  If gauze is used, follow your caregiver's directions for  changing the gauze.  To avoid spreading the infection:  Keep your draining abscess covered with a bandage.  Wash your hands well.  Do not share personal care items, towels, or whirlpools with others.  Avoid skin contact with others.  Keep your skin and clothes clean around the abscess.  Keep all follow-up appointments as directed by your caregiver. SEEK MEDICAL CARE IF:   You have increased pain, swelling, redness, fluid drainage, or bleeding.  You have muscle aches, chills, or a general ill feeling.  You have a fever. MAKE SURE YOU:   Understand these instructions.  Will watch your condition.  Will get help right away if you are not doing well or get worse. Document Released: 01/09/2005 Document Revised: 10/01/2011 Document Reviewed: 06/14/2011 [facility] Patient Information 2015 [place], [place]. This information is not intended to replace advice given to you by your health care provider. Make sure you discuss any questions you have with your health care provider.   Cellulitis Cellulitis is an infection of the skin and the tissue beneath it. The infected area is usually red and tender. Cellulitis occurs most often in the arms and lower legs.  CAUSES  Cellulitis is caused by bacteria that enter the skin through cracks or cuts in the skin. The most common types of bacteria that cause cellulitis are staphylococci and streptococci. SIGNS AND SYMPTOMS   Redness and warmth.  Swelling.  Tenderness or pain.  Fever. DIAGNOSIS  Your health care provider can usually determine what is wrong based on a physical exam. Blood tests  may also be done. TREATMENT  Treatment usually involves taking an antibiotic medicine. HOME CARE INSTRUCTIONS   Take your antibiotic medicine as directed by your health care provider. Finish the antibiotic even if you start to feel better.  Keep the infected arm or leg elevated to reduce swelling.  Apply a warm cloth to the affected area up to 4  times per day to relieve pain.  Take medicines only as directed by your health care provider.  Keep all follow-up visits as directed by your health care provider. SEEK MEDICAL CARE IF:   You notice red streaks coming from the infected area.  Your red area gets larger or turns dark in color.  Your bone or joint underneath the infected area becomes painful after the skin has healed.  Your infection returns in the same area or another area.  You notice a swollen bump in the infected area.  You develop new symptoms.  You have a fever. SEEK IMMEDIATE MEDICAL CARE IF:   You feel very sleepy.  You develop vomiting or diarrhea.  You have a general ill feeling (malaise) with muscle aches and pains. MAKE SURE YOU:   Understand these instructions.  Will watch your condition.  Will get help right away if you are not doing well or get worse. Document Released: 01/09/2005 Document Revised: 08/16/2013 Document Reviewed: 06/17/2011 [facility] Patient Information 2015 [place], [place]. This information is not intended to replace advice given to you by your health care provider. Make sure you discuss any questions you have with your health care provider.

## 2014-03-04 ENCOUNTER — Emergency Department (INDEPENDENT_AMBULATORY_CARE_PROVIDER_SITE_OTHER)
Admission: EM | Admit: 2014-03-04 | Discharge: 2014-03-04 | Disposition: A | Discharge status: Home / Self Care | Payer: BC Managed Care – PPO | Source: Home / Self Care | Attending: Emergency Medicine | Admitting: Emergency Medicine

## 2014-03-04 ENCOUNTER — Encounter: Payer: Self-pay | Admitting: Emergency Medicine

## 2014-03-04 DIAGNOSIS — L02214 Cutaneous abscess of groin: Secondary | ICD-10-CM

## 2014-03-04 NOTE — Discharge Instructions (Signed)

## 2014-03-04 NOTE — Telephone Encounter (Signed)
Yes I can see her  

## 2014-03-04 NOTE — ED Provider Notes (Signed)
CSN: [id number]     Arrival date & time 03/04/14  1544 History   First MD Initiated Contact with Patient 03/04/14 1603     Chief Complaint  Patient presents with  . Wound Check   (Consider location/radiation/quality/duration/timing/severity/associated sxs/prior Treatment) Patient is a 53 y.o. female presenting with wound check. The history is provided by the patient. No language interpreter was used.  Wound Check This is a new problem. The current episode started yesterday. The problem occurs constantly. The problem has been gradually worsening. Nothing aggravates the symptoms. The treatment provided moderate relief.  Pt had I and D here yesterday by Dr. Assunta Found.  Pt reports she feels much better.  Pt reports decreased redness and swelling.   Past Medical History  Diagnosis Date  . Headache(784.0)   . Migraine   . SVD (spontaneous vaginal delivery)     x 1  . Anemia     history   Past Surgical History  Procedure Laterality Date  . Shoulder surgery      to remove bone spur 11-2007 Dr Tonita Cong  . Diagnostic laparoscopy      x 2  . Tonsillectomy    . Wisdom tooth extraction    . Breast surgery      marker in left breast - watching an area  . Dilatation & currettage/hysteroscopy with resectocope N/A 06/25/2012    Procedure: DILATATION & CURETTAGE/HYSTEROSCOPY WITH RESECTOCOPE;  Surgeon: Farrel Gobble. Harrington Challenger, MD;  Location: [facility];  Service: Gynecology;  Laterality: N/A;   Family History  Problem Relation Age of Onset  . Migraines      family hx  . Hyperlipidemia      family hx  . Hypertension      family hx  . Prostate cancer      family hx 1st degree <50  . Heart disease Father   . Leukemia Paternal Uncle    History  Substance Use Topics  . Smoking status: Never Smoker   . Smokeless tobacco: Never Used  . Alcohol Use: Yes     Comment: occasional   OB History    No data available     Review of Systems  Skin: Positive for wound.  All other systems reviewed and are  negative.   Allergies  Sulfonamide derivatives  Home Medications   Prior to Admission medications   Medication Sig Start Date End Date Taking? Authorizing Provider  doxycycline (VIBRAMYCIN) 100 MG capsule Take 1 capsule (100 mg total) by mouth 2 (two) times daily. 03/03/14   Kandra Nicolas, MD  HYDROcodone-acetaminophen (VICODIN) 5-500 MG per tablet Take 1 tablet by mouth every 4 (four) hours as needed for pain. 06/25/12   Kendra H. Harrington Challenger, MD  ibuprofen (ADVIL,MOTRIN) 600 MG tablet Take 1 tablet (600 mg total) by mouth every 6 (six) hours as needed for pain. 06/25/12   Kendra H. Harrington Challenger, MD  OVER THE COUNTER MEDICATION Take 2 capsules by mouth daily. Cellular Vitality Complex    Historical Provider, MD  OVER THE COUNTER MEDICATION Take 2 capsules by mouth daily. Food Nutrient Complex    Historical Provider, MD  OVER THE COUNTER MEDICATION Take 2 capsules by mouth daily. Essential Oil Omega Complex    Historical Provider, MD  OVER THE COUNTER MEDICATION Take 1 capsule by mouth 2 (two) times daily. Utrophin PMG    Historical Provider, MD  Probiotic Product (PROBIOTIC DAILY PO) Take 1-2 capsules by mouth daily. Takes once or twice daily    Historical Provider, MD  BP 106/64 mmHg  Pulse 76  Temp(Src) 98.3 F (36.8 C) (Oral)  Resp 16  Ht 5\' 6"  (1.676 m)  Wt 141 lb (63.957 kg)  BMI 22.77 kg/m2  SpO2 99%  LMP 02/11/2011 Physical Exam  Constitutional: She is oriented to person, place, and time. She appears well-developed and well-nourished.  HENT:  Head: Normocephalic.  Cardiovascular: Normal rate.   Musculoskeletal: She exhibits tenderness.  Incision right groin,  Decreased redness groin from marked areas.   Neurological: She is alert and oriented to person, place, and time. She has normal reflexes.  Skin: There is erythema.  Psychiatric: She has a normal mood and affect.  Nursing note and vitals reviewed.   ED Course  Procedures (including critical care time) Labs Review Labs  Reviewed - No data to display  Imaging Review No results found.   MDM   1. Abscess of right groin    Pt advised to continue current medication.  Counseled on wound care.   Pt advised to return if any problems.   [place], PA-C 03/04/14 1705

## 2014-03-04 NOTE — ED Notes (Signed)
Recheck of I & D to right groin area yesterday.

## 2014-03-04 NOTE — Telephone Encounter (Signed)
Pt has been sch for 05-04-14

## 2014-03-06 LAB — WOUND CULTURE
GRAM STAIN: NONE SEEN
Gram Stain: NONE SEEN

## 2014-03-07 ENCOUNTER — Telehealth: Payer: Self-pay | Admitting: *Deleted

## 2014-05-04 ENCOUNTER — Ambulatory Visit: Payer: BC Managed Care – PPO | Admitting: Family Medicine

## 2014-06-06 ENCOUNTER — Ambulatory Visit (INDEPENDENT_AMBULATORY_CARE_PROVIDER_SITE_OTHER): Payer: BLUE CROSS/BLUE SHIELD | Admitting: Family Medicine

## 2014-06-06 ENCOUNTER — Encounter: Payer: Self-pay | Admitting: Family Medicine

## 2014-06-06 VITALS — BP 118/63 | HR 70 | Temp 98.7°F | Ht 66.0 in | Wt 144.0 lb

## 2014-06-06 DIAGNOSIS — L732 Hidradenitis suppurativa: Secondary | ICD-10-CM | POA: Insufficient documentation

## 2014-06-06 MED ORDER — MUPIROCIN 2 % EX OINT: 1.0000 "application " | TOPICAL_OINTMENT | Freq: Two times a day (BID) | CUTANEOUS | Status: DC

## 2014-06-06 MED ORDER — DOXYCYCLINE HYCLATE 100 MG PO CAPS: 100.0000 mg | ORAL_CAPSULE | Freq: Two times a day (BID) | ORAL | Status: DC

## 2014-06-06 NOTE — Progress Notes (Signed)
   Subjective:    Patient ID: Brittany Bartlett, female    DOB: 04-26-1960, 54 y.o.   MRN: [id number]  HPI 54 yr old female to re-establish with [place] and to discuss recurrent boils. About one year ago re started having recurrent boils in the groin region, and she has averaged getting these about once every month. Most of the time she can dry these up by applying tea tree oil to the skin, however once last November she went to Urgent Care and had one of these lanced open. 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 She denies any nasal problems. She has made an appt to see Dr. Jari Pigg of dermatology on 06-14-14.    Review of Systems  Constitutional: Negative.   Respiratory: Negative.   Cardiovascular: Negative.   Skin: Positive for wound.       Objective:   Physical Exam  Constitutional: She appears well-developed and well-nourished.  HENT:  Nose: Nose normal.  Cardiovascular: Normal rate, regular rhythm, normal heart sounds and intact distal pulses.   Pulmonary/Chest: Effort normal and breath sounds normal.  Skin: Skin is warm and dry. No rash noted. No erythema.          Assessment & Plan:  She has hydradenitis. We will try to eliminate the bacteria with Doxycycline 100 mg bid for 90 days, also by using Bactroban ointment in the nose bid. She will see Dr. Delman Cheadle as scheduled.

## 2014-06-06 NOTE — Progress Notes (Signed)
Pre visit review using our clinic review tool, if applicable. No additional management support is needed unless otherwise documented below in the visit note. 

## 2014-06-09 ENCOUNTER — Telehealth: Payer: Self-pay | Admitting: Family Medicine

## 2014-06-09 NOTE — Telephone Encounter (Signed)
Pt would like a call back about her lab results that was suppose to come over from her gyn

## 2014-06-10 NOTE — Telephone Encounter (Signed)
I reviewed her labs and the only number I am concerned about is her LDL at 161. She needs to reduce the fatty foods in her diet. Her glucose control is excellent and her liver enzymes are fine.

## 2014-06-10 NOTE — Telephone Encounter (Signed)
Please see paper fa

## 2014-06-10 NOTE — Telephone Encounter (Signed)
See fax

## 2014-06-10 NOTE — Telephone Encounter (Signed)
I left a voice message with below information. 

## 2014-09-05 ENCOUNTER — Encounter: Payer: Self-pay | Admitting: Gastroenterology

## 2014-09-26 ENCOUNTER — Emergency Department (HOSPITAL_BASED_OUTPATIENT_CLINIC_OR_DEPARTMENT_OTHER): Payer: BLUE CROSS/BLUE SHIELD

## 2014-09-26 ENCOUNTER — Emergency Department (HOSPITAL_BASED_OUTPATIENT_CLINIC_OR_DEPARTMENT_OTHER)
Admission: EM | Admit: 2014-09-26 | Discharge: 2014-09-26 | Disposition: A | Discharge status: Home / Self Care | Payer: BLUE CROSS/BLUE SHIELD | Attending: Emergency Medicine | Admitting: Emergency Medicine

## 2014-09-26 ENCOUNTER — Other Ambulatory Visit: Payer: Self-pay

## 2014-09-26 ENCOUNTER — Encounter (HOSPITAL_BASED_OUTPATIENT_CLINIC_OR_DEPARTMENT_OTHER): Payer: Self-pay | Admitting: Emergency Medicine

## 2014-09-26 DIAGNOSIS — Z862 Personal history of diseases of the blood and blood-forming organs and certain disorders involving the immune mechanism: Secondary | ICD-10-CM | POA: Insufficient documentation

## 2014-09-26 DIAGNOSIS — R200 Anesthesia of skin: Secondary | ICD-10-CM | POA: Diagnosis present

## 2014-09-26 DIAGNOSIS — R202 Paresthesia of skin: Secondary | ICD-10-CM | POA: Diagnosis not present

## 2014-09-26 DIAGNOSIS — Z79899 Other long term (current) drug therapy: Secondary | ICD-10-CM | POA: Diagnosis not present

## 2014-09-26 DIAGNOSIS — Z8679 Personal history of other diseases of the circulatory system: Secondary | ICD-10-CM | POA: Insufficient documentation

## 2014-09-26 LAB — CBC WITH DIFFERENTIAL/PLATELET
BASOS ABS: 0 10*3/uL (ref 0.0–0.1)
BASOS PCT: 1 % (ref 0–1)
EOS PCT: 1 % (ref 0–5)
Eosinophils Absolute: 0.1 10*3/uL (ref 0.0–0.7)
HEMATOCRIT: 40.7 % (ref 36.0–46.0)
Hemoglobin: 13.5 g/dL (ref 12.0–15.0)
Lymphocytes Relative: 35 % (ref 12–46)
Lymphs Abs: 1.5 10*3/uL (ref 0.7–4.0)
MCH: 30.2 pg (ref 26.0–34.0)
MCHC: 33.2 g/dL (ref 30.0–36.0)
MCV: 91.1 fL (ref 78.0–100.0)
MONO ABS: 0.4 10*3/uL (ref 0.1–1.0)
Monocytes Relative: 9 % (ref 3–12)
Neutro Abs: 2.3 10*3/uL (ref 1.7–7.7)
Neutrophils Relative %: 54 % (ref 43–77)
PLATELETS: 227 10*3/uL (ref 150–400)
RBC: 4.47 MIL/uL (ref 3.87–5.11)
RDW: 12.3 % (ref 11.5–15.5)
WBC: 4.2 10*3/uL (ref 4.0–10.5)

## 2014-09-26 LAB — BASIC METABOLIC PANEL
Anion gap: 6 (ref 5–15)
BUN: 11 mg/dL (ref 6–20)
CHLORIDE: 107 mmol/L (ref 101–111)
CO2: 28 mmol/L (ref 22–32)
Calcium: 9 mg/dL (ref 8.9–10.3)
Creatinine, Ser: 0.84 mg/dL (ref 0.44–1.00)
GFR calc Af Amer: >60 mL/min (ref >60)
GFR calc non Af Amer: >60 mL/min (ref >60)
Glucose, Bld: 97 mg/dL (ref 65–99)
POTASSIUM: 4.3 mmol/L (ref 3.5–5.1)
SODIUM: 141 mmol/L (ref 135–145)

## 2014-09-26 NOTE — ED Provider Notes (Signed)
CSN: [id number]     Arrival date & time 09/26/14  1145 History   First MD Initiated Contact with Patient 09/26/14 1157     Chief Complaint  Patient presents with  . Numbness     (Consider location/radiation/quality/duration/timing/severity/associated sxs/prior Treatment) Patient is a 54 y.o. female presenting with neurologic complaint.  Neurologic Problem This is a new problem. The current episode started yesterday. The problem occurs constantly. The problem has been gradually improving. Pertinent negatives include no chest pain, no abdominal pain, no headaches and no shortness of breath. Associated symptoms comments: L foot tingling, pain, tingling of LLE and LUE. Nothing aggravates the symptoms. Nothing relieves the symptoms. She has tried nothing for the symptoms.    Past Medical History  Diagnosis Date  . Headache(784.0)   . Migraine   . SVD (spontaneous vaginal delivery)     x 1  . Anemia     history   Past Surgical History  Procedure Laterality Date  . Shoulder surgery      to remove bone spur 11-2007 Dr Tonita Cong  . Diagnostic laparoscopy      x 2  . Tonsillectomy    . Wisdom tooth extraction    . Breast surgery      marker in left breast - watching an area  . Dilatation & currettage/hysteroscopy with resectocope N/A 06/25/2012    Procedure: DILATATION & CURETTAGE/HYSTEROSCOPY WITH RESECTOCOPE;  Surgeon: Farrel Gobble. Harrington Challenger, MD;  Location: [facility];  Service: Gynecology;  Laterality: N/A;   Family History  Problem Relation Age of Onset  . Migraines      family hx  . Hyperlipidemia      family hx  . Hypertension      family hx  . Prostate cancer      family hx 1st degree <50  . Heart disease Father   . Leukemia Paternal Uncle    History  Substance Use Topics  . Smoking status: Never Smoker   . Smokeless tobacco: Never Used  . Alcohol Use: 0.0 oz/week    0 Standard drinks or equivalent per week     Comment: occ   OB History    No data available     Review of  Systems  Respiratory: Negative for shortness of breath.   Cardiovascular: Negative for chest pain.  Gastrointestinal: Negative for abdominal pain.  Neurological: Negative for headaches.  All other systems reviewed and are negative.     Allergies  Sulfonamide derivatives  Home Medications   Prior to Admission medications   Medication Sig Start Date End Date Taking? Authorizing Provider  mupirocin ointment (BACTROBAN) 2 % Place 1 application into the nose 2 (two) times daily. Patient not taking: Reported on 09/28/2014 06/06/14   Laurey Morale, MD  OVER THE COUNTER MEDICATION Take 2 capsules by mouth daily. Cellular Vitality Complex    Historical Provider, MD  OVER THE COUNTER MEDICATION Take 2 capsules by mouth daily. Food Nutrient Complex    Historical Provider, MD  OVER THE COUNTER MEDICATION Take 2 capsules by mouth daily. Essential Oil Omega Complex    Historical Provider, MD  Probiotic Product (PROBIOTIC DAILY PO) Take 1-2 capsules by mouth daily. Takes once or twice daily    Historical Provider, MD   BP 164/82 mmHg  Pulse 89  Temp(Src) 98.7 F (37.1 C) (Oral)  Resp 28  Ht 5\' 6"  (1.676 m)  Wt 140 lb (63.504 kg)  BMI 22.61 kg/m2  SpO2 100%  LMP 02/11/2011 Physical Exam  Constitutional: She is oriented to person, place, and time. She appears well-developed and well-nourished.  HENT:  Head: Normocephalic and atraumatic.  Right Ear: External ear normal.  Left Ear: External ear normal.  Eyes: Conjunctivae and EOM are normal. Pupils are equal, round, and reactive to light.  Neck: Normal range of motion. Neck supple.  Cardiovascular: Normal rate, regular rhythm, normal heart sounds and intact distal pulses.   Pulmonary/Chest: Effort normal and breath sounds normal.  Abdominal: Soft. Bowel sounds are normal. There is no tenderness.  Musculoskeletal: Normal range of motion.  Neurological: She is alert and oriented to person, place, and time. She has normal strength and normal  reflexes. No cranial nerve deficit or sensory deficit. Coordination normal. GCS eye subscore is 4. GCS verbal subscore is 5. GCS motor subscore is 6.  Skin: Skin is warm and dry.  Vitals reviewed.   ED Course  Procedures (including critical care time) Labs Review Labs Reviewed  CBC WITH DIFFERENTIAL/PLATELET  BASIC METABOLIC PANEL    Imaging Review No results found.   EKG Interpretation   Date/Time:  Monday September 26 2014 13:07:07 EDT Ventricular Rate:  59 PR Interval:  130 QRS Duration: 86 QT Interval:  430 QTC Calculation: 425 R Axis:   68 Text Interpretation:  Sinus bradycardia Otherwise normal ECG ED PHYSICIAN  INTERPRETATION AVAILABLE IN [facility] Confirmed by TEST, Record  ([id number]) on 09/29/2014 6:53:23 AM      MDM   Final diagnoses:  Tingling    53 y.o. female with pertinent PMH of prior migraine presents with atypical neurologic symptoms as above.  Pt was in normal state of health until yesterday, began to develop LLE tingling and foot pain, primarily over sole. This improved, was followed by LUE tingling and neck pain.  On arrival pt has pain over sole of foot, otherwise asymptomatic.  Physical exam bengin, no focal neuro deficits.  Wu unremarkable.  Pt had improvement of symptoms after reassurance.  Suspect radicular pain, however atypical migraine is also possible.  DC home in stable condition.    I have reviewed all laboratory and imaging studies if ordered as above  1. Tingling         Debby Freiberg, MD 09/29/14 [phone]

## 2014-09-26 NOTE — ED Notes (Signed)
Patient reports that she is having numbness that started yesterday lasting about 15 - 20 minutes and went away. Today she felt a sharp "stab of pain with tingling to her left foot and gradual increasing up her left side. Numbness to her left arm, left neck , left face, left leg. Grips equal, pupils equal and reactive, steady gait. Slightly anxious

## 2014-09-26 NOTE — Discharge Instructions (Signed)

## 2014-09-28 ENCOUNTER — Ambulatory Visit (INDEPENDENT_AMBULATORY_CARE_PROVIDER_SITE_OTHER): Payer: BLUE CROSS/BLUE SHIELD | Admitting: Family Medicine

## 2014-09-28 ENCOUNTER — Encounter: Payer: Self-pay | Admitting: Family Medicine

## 2014-09-28 VITALS — BP 105/58 | HR 76 | Temp 98.4°F | Ht 66.0 in | Wt 142.0 lb

## 2014-09-28 DIAGNOSIS — M542 Cervicalgia: Secondary | ICD-10-CM

## 2014-09-28 DIAGNOSIS — G5762 Lesion of plantar nerve, left lower limb: Secondary | ICD-10-CM

## 2014-09-28 NOTE — Progress Notes (Signed)
Pre visit review using our clinic review tool, if applicable. No additional management support is needed unless otherwise documented below in the visit note. 

## 2014-09-29 ENCOUNTER — Encounter: Payer: Self-pay | Admitting: Family Medicine

## 2014-09-29 NOTE — Progress Notes (Signed)
   Subjective:    Patient ID: Brittany Bartlett, female    DOB: [birth year], 54 y.o.   MRN: [id number]  HPI Here to follow up an ER visit on 09-26-14 for numbness and/or pain in the left side of the body. She has had trouble with pain or numbness from the left side of the neck which radiates down the left arm for the past few months. This is intermittent. No hx of trauma. Also she has had intermittent sharp pains in the left foot for the past few weeks. This starts in the forefoot and spometimes shoots down to the 3rd and 4th toes. Sometimes she has numbness or tingling in these toes. Then on 09-26-14 she suddenly developed both of these symltom complexes at the same time. This alarmed her and she worrieds she was having a stroke so she went to the ER. Her workup these included labs and a head CT, all of which were normal. She was told to follow up with [place]. These sx have mostly resolved but she still gets milder versions of these at times. No other neurologic deficits. No HAs.    Review of Systems  Constitutional: Negative.   HENT: Negative.   Eyes: Negative.   Respiratory: Negative.   Cardiovascular: Negative.   Gastrointestinal: Negative.   Neurological: Positive for numbness. Negative for dizziness, tremors, seizures, syncope, facial asymmetry, speech difficulty, weakness, light-headedness and headaches.       Objective:   Physical Exam  Constitutional: She is oriented to person, place, and time. She appears well-developed and well-nourished. No distress.  Eyes: Conjunctivae and EOM are normal. Pupils are equal, round, and reactive to light.  Cardiovascular: Normal rate, regular rhythm, normal heart sounds and intact distal pulses.   Pulmonary/Chest: Effort normal and breath sounds normal.  Musculoskeletal:  She is tender on the left posterior neck, full ROM. She is also tender on the left dorsal foot between the 3rd and 4th metatarsals. No swelling.   Neurological: She is alert and oriented to  person, place, and time. She has normal reflexes. No cranial nerve deficit. She exhibits normal muscle tone. Coordination normal.          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 These are not related. She will try wearing good shoes with arch supports, use warm soaks, and take Ibuprofen for the neuroma. Try heat, Ibuprofen, and stretces for the neck. She will recheck if these get worse.

## 2015-01-16 ENCOUNTER — Other Ambulatory Visit: Payer: Self-pay

## 2015-01-16 DIAGNOSIS — Z1231 Encounter for screening mammogram for malignant neoplasm of breast: Secondary | ICD-10-CM

## 2015-02-27 ENCOUNTER — Ambulatory Visit: Payer: BLUE CROSS/BLUE SHIELD

## 2015-03-20 ENCOUNTER — Ambulatory Visit
Admission: RE | Admit: 2015-03-20 | Discharge: 2015-03-20 | Disposition: A | Discharge status: Home / Self Care | Payer: BLUE CROSS/BLUE SHIELD | Source: Ambulatory Visit

## 2015-03-20 DIAGNOSIS — Z1231 Encounter for screening mammogram for malignant neoplasm of breast: Secondary | ICD-10-CM

## 2015-05-16 ENCOUNTER — Other Ambulatory Visit: Payer: Self-pay | Admitting: Family Medicine

## 2015-07-22 ENCOUNTER — Encounter (HOSPITAL_BASED_OUTPATIENT_CLINIC_OR_DEPARTMENT_OTHER): Payer: Self-pay

## 2015-07-22 DIAGNOSIS — M79605 Pain in left leg: Secondary | ICD-10-CM | POA: Diagnosis not present

## 2015-07-22 NOTE — ED Notes (Signed)
Pt reports one day history of left lower leg pain, popliteal area down to left ankle, sudden onset while sitting down eating with friends, denies known injury. Denies shortness of breath. No history of DVT. No obvious swelling noted.

## 2015-07-23 ENCOUNTER — Emergency Department (HOSPITAL_BASED_OUTPATIENT_CLINIC_OR_DEPARTMENT_OTHER)
Admission: EM | Admit: 2015-07-23 | Discharge: 2015-07-23 | Disposition: A | Discharge status: Home / Self Care | Payer: BLUE CROSS/BLUE SHIELD | Attending: Emergency Medicine | Admitting: Emergency Medicine

## 2015-07-23 DIAGNOSIS — M79605 Pain in left leg: Secondary | ICD-10-CM

## 2015-07-23 LAB — D-DIMER, QUANTITATIVE (NOT AT ARMC): D-Dimer, Quant: <0.27 ug/mL-FEU (ref 0.00–0.50)

## 2015-07-23 NOTE — ED Notes (Signed)
MD at bedside. 

## 2015-07-23 NOTE — ED Provider Notes (Addendum)
CSN: [id number]     Arrival date & time 07/22/15  2311 History   First MD Initiated Contact with Patient 07/23/15 0254     Chief Complaint  Patient presents with  . Leg Pain     (Consider location/radiation/quality/duration/timing/severity/associated sxs/prior Treatment) HPI  This is a 55 year old female who had the fairly sudden onset of pain in the back of her left thigh. This occurred about 7:30 PM yesterday evening. There was no trauma. She subsequently developed mild swelling and pain in her left ankle. She describes it as feeling like she sprained her ankle. The left ankle felt stiff. She describes the thigh pain as feeling like she was punched in the back of the thigh. She took 2 Aleve at 10:30 PM and the pain is significantly improved and the ankle edema has resolved. Pain is worse with movement or palpation.  Past Medical History  Diagnosis Date  . Headache(784.0)   . Migraine   . SVD (spontaneous vaginal delivery)     x 1  . Anemia     history   Past Surgical History  Procedure Laterality Date  . Shoulder surgery      to remove bone spur 11-2007 Dr Tonita Cong  . Diagnostic laparoscopy      x 2  . Tonsillectomy    . Wisdom tooth extraction    . Breast surgery      marker in left breast - watching an area  . Dilatation & currettage/hysteroscopy with resectocope N/A 06/25/2012    Procedure: DILATATION & CURETTAGE/HYSTEROSCOPY WITH RESECTOCOPE;  Surgeon: Farrel Gobble. Harrington Challenger, MD;  Location: [facility];  Service: Gynecology;  Laterality: N/A;   Family History  Problem Relation Age of Onset  . Migraines      family hx  . Hyperlipidemia      family hx  . Hypertension      family hx  . Prostate cancer      family hx 1st degree <50  . Heart disease Father   . Leukemia Paternal Uncle    Social History  Substance Use Topics  . Smoking status: Never Smoker   . Smokeless tobacco: Never Used  . Alcohol Use: 0.0 oz/week    0 Standard drinks or equivalent per week     Comment: occ    OB History    No data available     Review of Systems  All other systems reviewed and are negative.   Allergies  Sulfonamide derivatives  Home Medications   Prior to Admission medications   Medication Sig Start Date End Date Taking? Authorizing Provider  doxycycline (VIBRAMYCIN) 100 MG capsule TAKE ONE CAPSULE BY MOUTH TWICE DAILY 05/16/15   Laurey Morale, MD  mupirocin ointment (BACTROBAN) 2 % Place 1 application into the nose 2 (two) times daily. Patient not taking: Reported on 09/28/2014 06/06/14   Laurey Morale, MD  OVER THE COUNTER MEDICATION Take 2 capsules by mouth daily. Cellular Vitality Complex    Historical Provider, MD  OVER THE COUNTER MEDICATION Take 2 capsules by mouth daily. Food Nutrient Complex    Historical Provider, MD  OVER THE COUNTER MEDICATION Take 2 capsules by mouth daily. Essential Oil Omega Complex    Historical Provider, MD  Probiotic Product (PROBIOTIC DAILY PO) Take 1-2 capsules by mouth daily. Takes once or twice daily    Historical Provider, MD   BP 118/72 mmHg  Pulse 76  Temp(Src) 98.1 F (36.7 C) (Oral)  Resp 16  Ht 5\' 6"  (1.676 m)  Wt 145 lb (65.772 kg)  BMI 23.41 kg/m2  SpO2 99%  LMP 02/11/2011   Physical Exam  General: Well-developed, well-nourished female in no acute distress; appearance consistent with age of record HENT: normocephalic; atraumatic Eyes: pupils equal, round and reactive to light; extraocular muscles intact Neck: supple Heart: regular rate and rhythm Lungs: clear to auscultation bilaterally Abdomen: soft; nondistended; nontender; bowel sounds present Extremities: No deformity; full range of motion; pulses normal; mild tenderness of left posterior thigh and ankle; no edema Neurologic: Awake, alert and oriented; motor function intact in all extremities and symmetric; no facial droop Skin: Warm and dry Psychiatric: Normal mood and affect    ED Course  Procedures (including critical care time)   MDM  Nursing  notes and vitals signs, including pulse oximetry, reviewed.  Summary of this visit's results, reviewed by myself:  Labs:  Results for orders placed or performed during the hospital encounter of 07/23/15 (from the past 24 hour(s))  D-dimer, quantitative (not at [facility])     Status: None   Collection Time: 07/23/15  3:18 AM  Result Value Ref Range   D-Dimer, Quant <0.27 0.00 - 0.50 ug/mL-FEU   Well's score 0.    Shanon Rosser, MD 07/23/15 War, MD 07/23/15 [phone]

## 2016-02-20 ENCOUNTER — Other Ambulatory Visit: Payer: Self-pay | Admitting: Family Medicine

## 2016-02-20 DIAGNOSIS — Z1231 Encounter for screening mammogram for malignant neoplasm of breast: Secondary | ICD-10-CM

## 2016-03-25 ENCOUNTER — Ambulatory Visit
Admission: RE | Admit: 2016-03-25 | Discharge: 2016-03-25 | Disposition: A | Discharge status: Home / Self Care | Payer: BLUE CROSS/BLUE SHIELD | Source: Ambulatory Visit | Attending: Family Medicine | Admitting: Family Medicine

## 2016-03-25 DIAGNOSIS — Z1231 Encounter for screening mammogram for malignant neoplasm of breast: Secondary | ICD-10-CM

## 2016-05-06 ENCOUNTER — Other Ambulatory Visit (HOSPITAL_COMMUNITY)
Admission: RE | Admit: 2016-05-06 | Discharge: 2016-05-06 | Disposition: A | Discharge status: Home / Self Care | Payer: BLUE CROSS/BLUE SHIELD | Source: Ambulatory Visit | Attending: Obstetrics and Gynecology | Admitting: Obstetrics and Gynecology

## 2016-05-06 ENCOUNTER — Other Ambulatory Visit: Payer: Self-pay | Admitting: Obstetrics and Gynecology

## 2016-05-06 DIAGNOSIS — Z1151 Encounter for screening for human papillomavirus (HPV): Secondary | ICD-10-CM | POA: Insufficient documentation

## 2016-05-06 DIAGNOSIS — Z01419 Encounter for gynecological examination (general) (routine) without abnormal findings: Secondary | ICD-10-CM | POA: Diagnosis present

## 2016-05-09 LAB — CYTOLOGY - PAP
DIAGNOSIS: NEGATIVE
HPV (WINDOPATH): NOT DETECTED

## 2016-05-29 IMAGING — CT CT HEAD W/O CM
1 series · 16 of 30 positions shown, 20 images · non-contrast
Comparison: None.

CLINICAL DATA: Numbness of the left body, arm, and leg with neck
tingling

EXAM:
CT HEAD WITHOUT CONTRAST
TECHNIQUE: Contiguous axial images were obtained from the base of the skull
through the vertex without intravenous contrast.

[Series 2: head 4.8 h37s · axial · 0.40mm/px · z∈[+1237,+1373]mm · 16 of 32 slices shown, 20 images]
[im 2/32  brain]
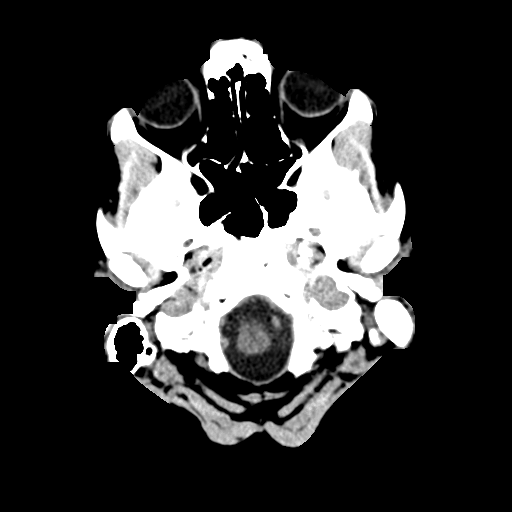
[im 2/32  bone]
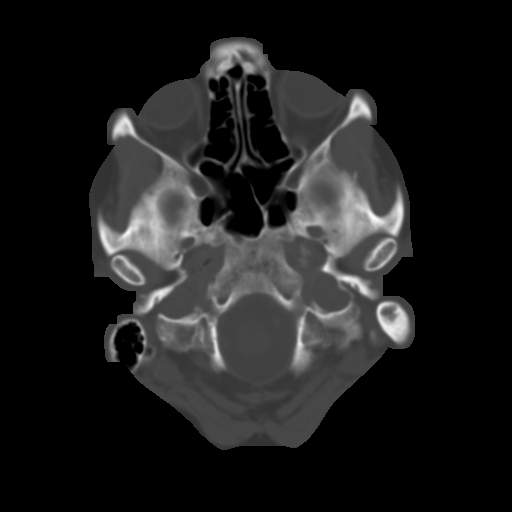
[im 4/32  brain]
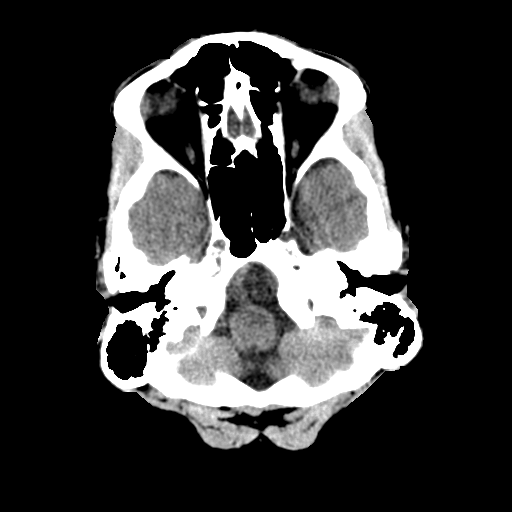
[im 6/32  brain]
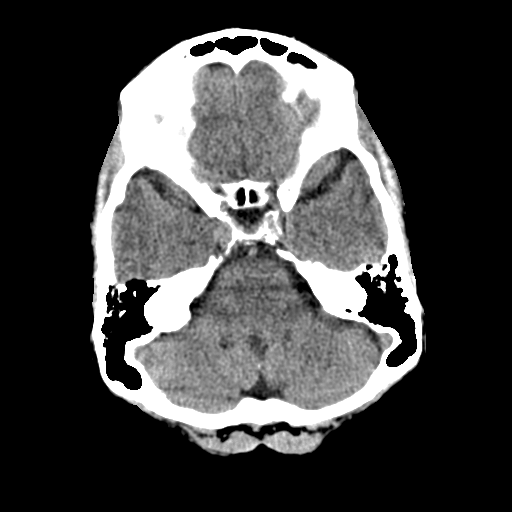
[im 8/32  brain]
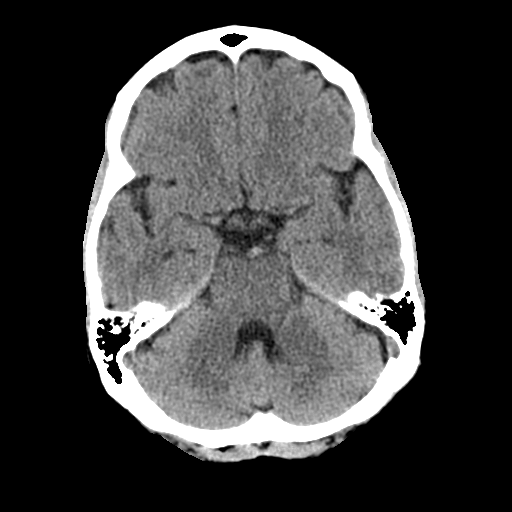
[im 9/32  brain]
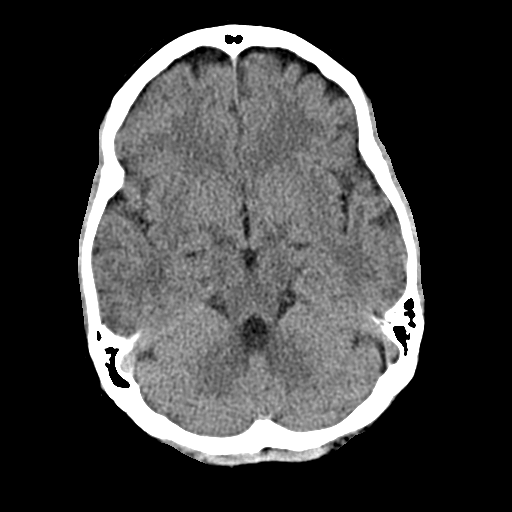
[im 9/32  bone]
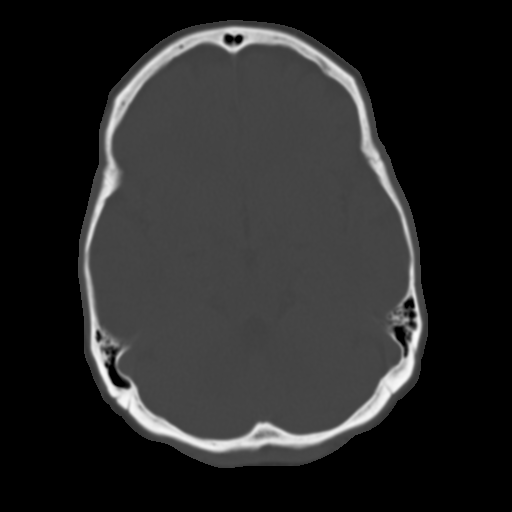
[im 11/32  brain]
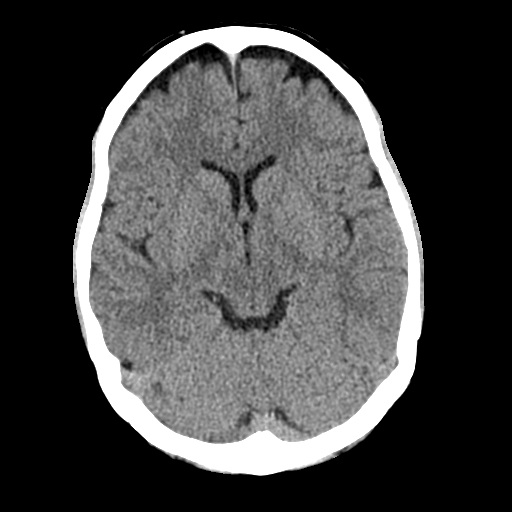
[im 13/32  brain]
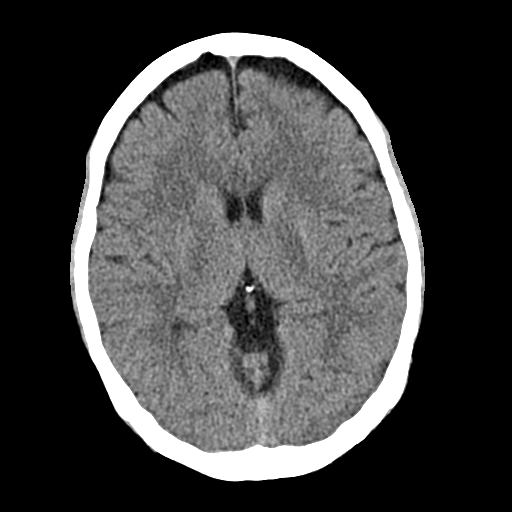
[im 15/32  brain]
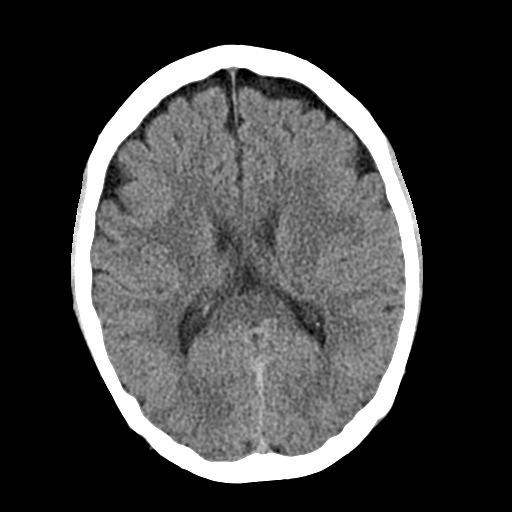
[im 17/32  brain]
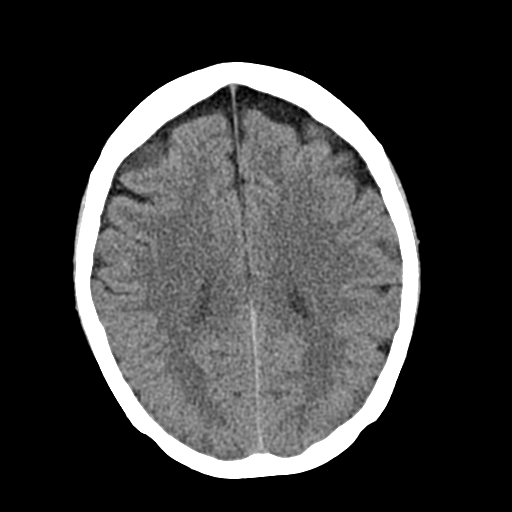
[im 17/32  bone]
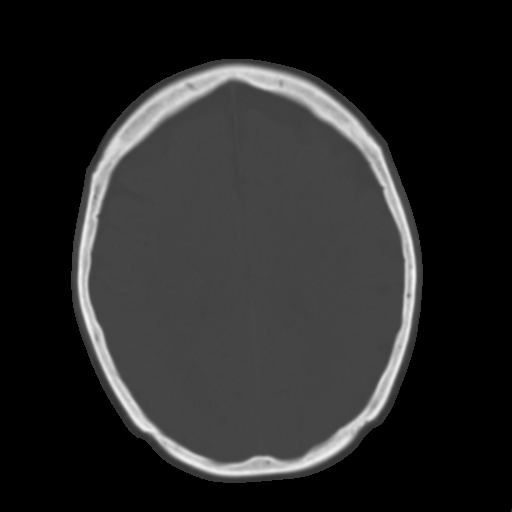
[im 19/32  brain]
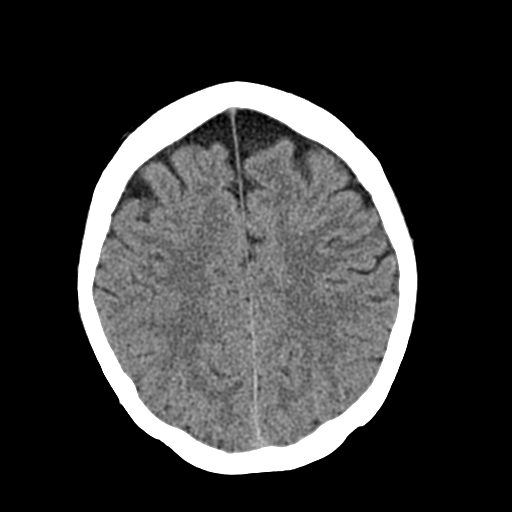
[im 21/32  brain]
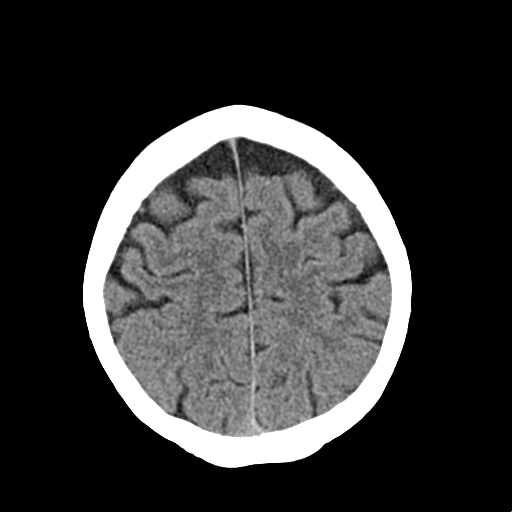
[im 23/32  brain]
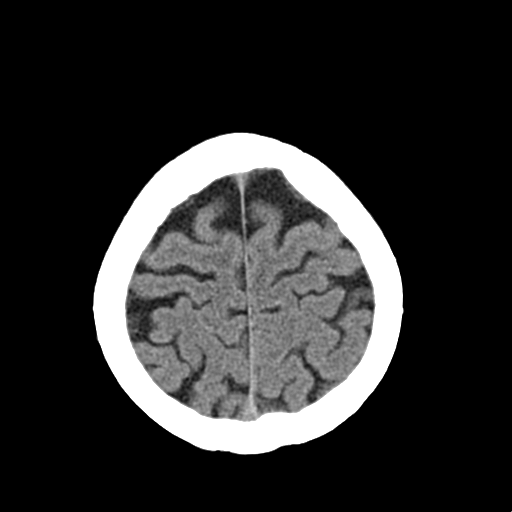
[im 24/32  brain]
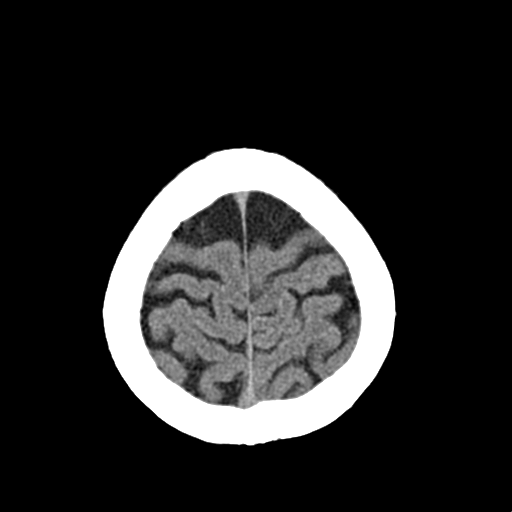
[im 24/32  bone]
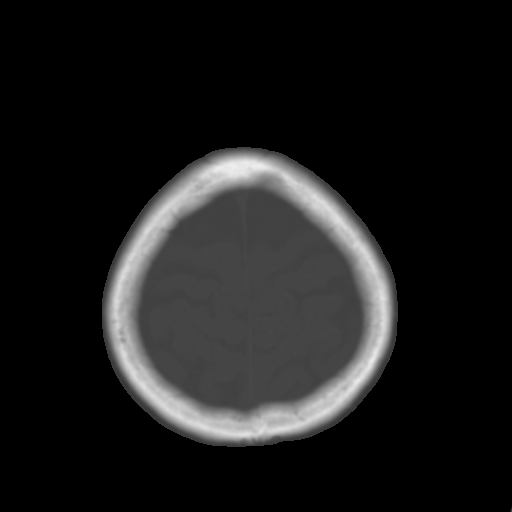
[im 26/32  brain]
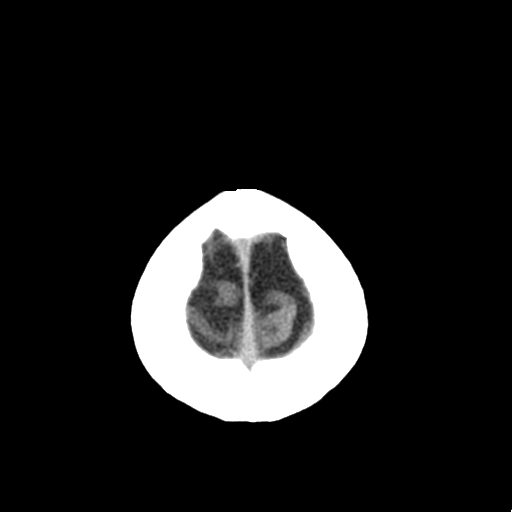
[im 28/32  brain]
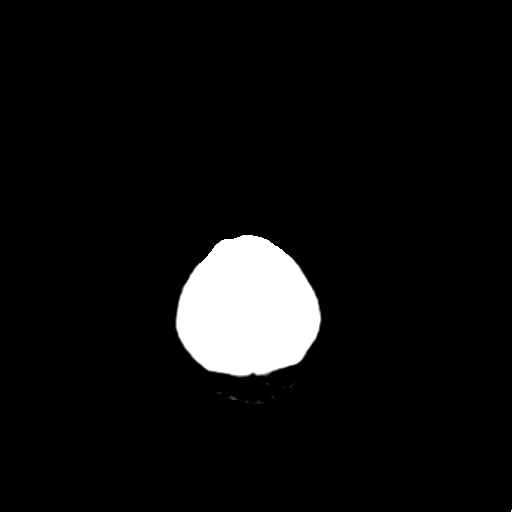
[im 30/32  brain]
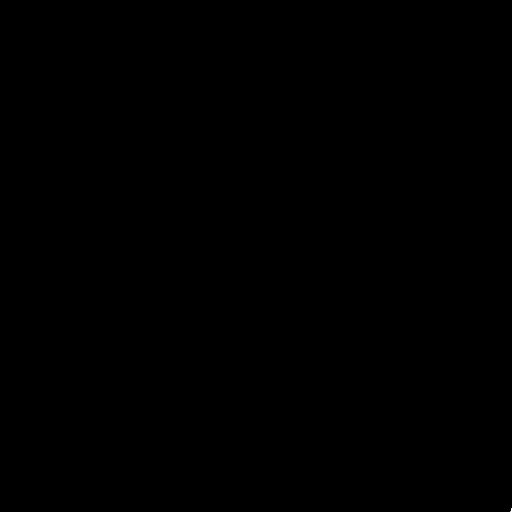

[16 of 30 positions shown; findings below may reference images not displayed]

FINDINGS: Skull and Sinuses:Negative for fracture or destructive process. The
mastoids, middle ears, and imaged paranasal sinuses are clear.

Orbits: No acute abnormality.

Brain: No evidence of acute or remote infarction, hemorrhage,
hydrocephalus, or mass lesion/mass effect. No white matter disease.
IMPRESSION: Negative head CT.

## 2016-07-30 ENCOUNTER — Emergency Department (HOSPITAL_BASED_OUTPATIENT_CLINIC_OR_DEPARTMENT_OTHER): Payer: BLUE CROSS/BLUE SHIELD

## 2016-07-30 ENCOUNTER — Emergency Department (HOSPITAL_BASED_OUTPATIENT_CLINIC_OR_DEPARTMENT_OTHER)
Admission: EM | Admit: 2016-07-30 | Discharge: 2016-07-30 | Disposition: A | Discharge status: Home / Self Care | Payer: BLUE CROSS/BLUE SHIELD | Attending: Emergency Medicine | Admitting: Emergency Medicine

## 2016-07-30 ENCOUNTER — Encounter (HOSPITAL_BASED_OUTPATIENT_CLINIC_OR_DEPARTMENT_OTHER): Payer: Self-pay | Admitting: *Deleted

## 2016-07-30 DIAGNOSIS — Y999 Unspecified external cause status: Secondary | ICD-10-CM | POA: Insufficient documentation

## 2016-07-30 DIAGNOSIS — S82831A Other fracture of upper and lower end of right fibula, initial encounter for closed fracture: Secondary | ICD-10-CM | POA: Diagnosis not present

## 2016-07-30 DIAGNOSIS — S99911A Unspecified injury of right ankle, initial encounter: Secondary | ICD-10-CM | POA: Diagnosis present

## 2016-07-30 DIAGNOSIS — S82839A Other fracture of upper and lower end of unspecified fibula, initial encounter for closed fracture: Secondary | ICD-10-CM

## 2016-07-30 DIAGNOSIS — Y939 Activity, unspecified: Secondary | ICD-10-CM | POA: Diagnosis not present

## 2016-07-30 DIAGNOSIS — W1839XA Other fall on same level, initial encounter: Secondary | ICD-10-CM | POA: Insufficient documentation

## 2016-07-30 DIAGNOSIS — Y929 Unspecified place or not applicable: Secondary | ICD-10-CM | POA: Diagnosis not present

## 2016-07-30 MED ORDER — ACETAMINOPHEN 325 MG PO TABS
650.0000 mg | ORAL_TABLET | Freq: Once | ORAL | Status: AC
  Administered 2016-07-30: 650 mg via ORAL
  Filled 2016-07-30: qty 2

## 2016-07-30 MED ORDER — HYDROCODONE-ACETAMINOPHEN 5-325 MG PO TABS
2.0000 | ORAL_TABLET | ORAL | 0 refills | Status: DC | PRN
Start: 2016-07-30 — End: 2016-08-27

## 2016-07-30 NOTE — Discharge Instructions (Signed)
Your xray showed you have an avulsion fracture of the fibula,   You can take tylenol as needed for the pain. You can also take the prescribed Norco as needed for the pain. If you do take the Norco, do not take the tylenol with it.   Call the orthopedic doctor tomorrow to arrange an appointment. You can also follow-up with the orthopedic doctor you have seen before. Call either one of them tomorrow to let them know you were seen in the Emergency Department and arrange an appointment for this week.  Do not bear weight on the leg until you are seen by orthopedics. Continue to use the crutches.   Return to the Emergency Department immediately for any worsening swelling, tense feeling of the leg, fevers, warmth, redness or any other worsening or concerning symptoms.

## 2016-07-30 NOTE — ED Provider Notes (Signed)
[facility] Provider Note   CSN: [id number] Arrival date & time: 07/30/16  1854   By signing my name below, I, Brittany Bartlett, attest that this documentation has been prepared under the direction and in the presence of Providence Lanius, [place]. Electronically Signed: Neta Bartlett, ED Scribe. 07/30/2016. 7:49 PM.   History   Chief Complaint Chief Complaint  Patient presents with  . Ankle Injury   The history is provided by the patient. No language interpreter was used.   HPI Comments:  Brittany Bartlett is a 56 y.o. female who presents to the Emergency Department with right ankle pain that began this evening at 1800 after a mechanical fall. Patient states that she stepped off a curb and twisted her right ankle inward. Patient states that she initially heard a snap when it happened. She has not been able to ambulate or bear weight on the leg since the incident. She states her current pain is 8/10 and describes it as a "throbbing and like sticking my foot in ice." She has not taken any medications. No head injury, knee pain, numbness/weakness.    Past Medical History:  Diagnosis Date  . Anemia    history  . Headache(784.0)   . Migraine   . SVD (spontaneous vaginal delivery)    x 1    Patient Active Problem List   Diagnosis Date Noted  . Hydradenitis 06/06/2014  . ANEMIA-NOS 01/09/2007  . BENIGN POSITIONAL VERTIGO 01/09/2007  . HEADACHE 12/03/2006    Past Surgical History:  Procedure Laterality Date  . BREAST SURGERY     marker in left breast - watching an area  . DIAGNOSTIC LAPAROSCOPY     x 2  . DILATATION & CURRETTAGE/HYSTEROSCOPY WITH RESECTOCOPE N/A 06/25/2012   Procedure: DILATATION & CURETTAGE/HYSTEROSCOPY WITH RESECTOCOPE;  Surgeon: Farrel Gobble. Harrington Challenger, MD;  Location: [facility];  Service: Gynecology;  Laterality: N/A;  . SHOULDER SURGERY     to remove bone spur 11-2007 Dr Tonita Cong  . TONSILLECTOMY    . WISDOM TOOTH EXTRACTION      OB History    No data  available       Home Medications    Prior to Admission medications   Medication Sig Start Date End Date Taking? Authorizing Provider  HYDROcodone-acetaminophen (NORCO/VICODIN) 5-325 MG tablet Take 2 tablets by mouth every 4 (four) hours as needed. 07/30/16   Volanda Napoleon, PA-C  mupirocin ointment (BACTROBAN) 2 % Place 1 application into the nose 2 (two) times daily. Patient not taking: Reported on 09/28/2014 06/06/14   Laurey Morale, MD  OVER THE COUNTER MEDICATION Take 2 capsules by mouth daily. Cellular Vitality Complex    Historical Provider, MD  OVER THE COUNTER MEDICATION Take 2 capsules by mouth daily. Food Nutrient Complex    Historical Provider, MD  OVER THE COUNTER MEDICATION Take 2 capsules by mouth daily. Essential Oil Omega Complex    Historical Provider, MD  Probiotic Product (PROBIOTIC DAILY PO) Take 1-2 capsules by mouth daily. Takes once or twice daily    Historical Provider, MD    Family History Family History  Problem Relation Age of Onset  . Migraines      family hx  . Hyperlipidemia      family hx  . Hypertension      family hx  . Prostate cancer      family hx 1st degree <50  . Heart disease Father   . Leukemia Paternal Uncle     Social  History Social History  Substance Use Topics  . Smoking status: Never Smoker  . Smokeless tobacco: Never Used  . Alcohol use 0.0 oz/week     Comment: occ     Allergies   Sulfonamide derivatives   Review of Systems Review of Systems  Constitutional: Negative for fever.  Respiratory: Negative for shortness of breath.   Cardiovascular: Negative for chest pain.  Gastrointestinal: Negative for nausea and vomiting.  Musculoskeletal: Positive for joint swelling.       +Right ankle pain  Neurological: Negative for headaches.  All other systems reviewed and are negative.    Physical Exam Updated Vital Signs BP 118/64 (BP Location: Right Arm)   Pulse 83   Temp 100 F (37.8 C) (Oral)   Resp 16   Ht 5\' 6"   (1.676 m)   Wt 62.1 kg   LMP 02/11/2011   SpO2 100%   BMI 22.11 kg/m   Physical Exam  Constitutional: She appears well-developed and well-nourished.  HENT:  Head: Normocephalic and atraumatic.  Eyes: Conjunctivae and EOM are normal. Right eye exhibits no discharge. Left eye exhibits no discharge. No scleral icterus.  Pulmonary/Chest: Effort normal.  Musculoskeletal: She exhibits no deformity.  Diffuse soft tissue swelling to right ankle, more significant at lateral malleolus. No obvious deformity. No ecchymosis. TTP at lateral malleolus. No tenderness to medial malleolus. No metatarsal tenderness. Neurovascularly intact. Good dorsoplantar flexion.   Neurological: She is alert.  Skin: Skin is warm and dry.  Psychiatric: She has a normal mood and affect. Her speech is normal and behavior is normal.     ED Treatments / Results  DIAGNOSTIC STUDIES:  Oxygen Saturation is 98% on RA, normal by my interpretation.    COORDINATION OF CARE:  7:47 PM Discussed treatment plan with pt at bedside and pt agreed to plan.   Labs (all labs ordered are listed, but only abnormal results are displayed) Labs Reviewed - No data to display  EKG  EKG Interpretation None       Radiology Dg Ankle Complete Right  Result Date: 07/30/2016 CLINICAL DATA:  [place] from a curb.  Lateral pain. EXAM: RIGHT ANKLE - COMPLETE 3+ VIEW COMPARISON:  02/21/2009 FINDINGS: Avulsion fracture of the tip of the fibula. Lateral soft tissue swelling. Fragment displaced only about 2 mm. No other regional abnormality. IMPRESSION: Avulsion fracture of the tip of the fibula. Electronically Signed   By: Nelson Chimes M.D.   On: 07/30/2016 19:40    Procedures Procedures (including critical care time)  Medications Ordered in ED Medications  acetaminophen (TYLENOL) tablet 650 mg (650 mg Oral Given 07/30/16 2007)     Initial Impression / Assessment and Plan / ED Course  I have reviewed the triage vital signs and the  nursing notes.  Pertinent labs & imaging results that were available during my care of the patient were reviewed by me and considered in my medical decision making (see chart for details).     56 y.o. F with right ankle pain after what appears to be an inversion injury. Patient neurovascularly intact with good movement of ankle. XR ordered at triage. Plan to give tylenol for pain relief.   XR reviewed and positive for a small avulsion fracture at the distal end of the fibula. Discussing imaging with patient. Will plan to splint patient with a stirrup splint for stabilization and provide crutches for non-weightbearing. Will provide patient with an ortho referral and instructed her to call tomorrow to arrange follow-up appointment. Discharged home  with short course of pain medications for symptomatic relief.  Return precautions discussed. Patient expresses understanding and agreement to plan.    Final Clinical Impressions(s) / ED Diagnoses   Final diagnoses:  Avulsion fracture of distal end of fibula    New Prescriptions Discharge Medication List as of 07/30/2016  8:31 PM    START taking these medications   Details  HYDROcodone-acetaminophen (NORCO/VICODIN) 5-325 MG tablet Take 2 tablets by mouth every 4 (four) hours as needed., Starting Tue 07/30/2016, Print      I personally performed the services described in this documentation, which was scribed in my presence. The recorded information has been reviewed and is accurate.     Volanda Napoleon, PA-C 07/31/16 [place], MD 08/02/16 [phone]

## 2016-07-30 NOTE — ED Notes (Signed)
ED Provider at bedside. 

## 2016-07-30 NOTE — ED Notes (Signed)
Family at bedside. 

## 2016-07-30 NOTE — ED Notes (Signed)
Pt stated she would use her on set of crutches at home, RN [facility] informed.

## 2016-07-30 NOTE — ED Triage Notes (Signed)
Patient brought in by EMS. Complaint of fall from standing injuring her right ankle, denies head injury.

## 2016-07-31 ENCOUNTER — Encounter: Payer: Self-pay | Admitting: Family Medicine

## 2016-07-31 ENCOUNTER — Ambulatory Visit (INDEPENDENT_AMBULATORY_CARE_PROVIDER_SITE_OTHER): Payer: BLUE CROSS/BLUE SHIELD | Admitting: Family Medicine

## 2016-07-31 DIAGNOSIS — S99911A Unspecified injury of right ankle, initial encounter: Secondary | ICD-10-CM

## 2016-07-31 NOTE — Patient Instructions (Signed)
You have a small avulsion fracture of your distal fibula. These are treated just like a severe ankle sprain. Ice the area for 15 minutes at a time, 3-4 times a day Consider aleve 2 tabs twice a day with food OR ibuprofen 3 tabs three times a day with food for pain and inflammation. Continue tylenol as you have been with the oxycodone as needed for severe pain. Elevate above the level of your heart when possible Crutches if needed to help with walking Bear weight when tolerated Ok to take boot off to wash area, do motion exercises, ice it. Come out of the boot twice a day to do Up/down and alphabet exercises 2-3 sets of each. Consider physical therapy for strengthening and balance exercises in the future. Follow up with me in 2 weeks.

## 2016-08-01 DIAGNOSIS — S99911D Unspecified injury of right ankle, subsequent encounter: Secondary | ICD-10-CM | POA: Insufficient documentation

## 2016-08-01 NOTE — Progress Notes (Signed)
PCP: Alysia Penna, MD  Subjective:   HPI: Patient is a 56 y.o. female here for right ankle injury.  Patient reports on 4/17 she stepped off a curb and inverted her right ankle. Immediate pain, difficulty bearing weight. Pain 3/10 and sharp lateral ankle now. Wearing sugar tong splint and using crutches. + swelling. Taking tylenol, has oxycodone for evening. She did sprain this ankle badly about 8 years ago. No skin changes, numbness.  Past Medical History:  Diagnosis Date  . Anemia    history  . Headache(784.0)   . Migraine   . SVD (spontaneous vaginal delivery)    x 1    Current Outpatient Prescriptions on File Prior to Visit  Medication Sig Dispense Refill  . HYDROcodone-acetaminophen (NORCO/VICODIN) 5-325 MG tablet Take 2 tablets by mouth every 4 (four) hours as needed. 14 tablet 0  . mupirocin ointment (BACTROBAN) 2 % Place 1 application into the nose 2 (two) times daily. (Patient not taking: Reported on 09/28/2014) 22 g 2  . OVER THE COUNTER MEDICATION Take 2 capsules by mouth daily. Cellular Vitality Complex    . OVER THE COUNTER MEDICATION Take 2 capsules by mouth daily. Food Nutrient Complex    . OVER THE COUNTER MEDICATION Take 2 capsules by mouth daily. Essential Oil Omega Complex    . Probiotic Product (PROBIOTIC DAILY PO) Take 1-2 capsules by mouth daily. Takes once or twice daily     No current facility-administered medications on file prior to visit.     Past Surgical History:  Procedure Laterality Date  . BREAST SURGERY     marker in left breast - watching an area  . DIAGNOSTIC LAPAROSCOPY     x 2  . DILATATION & CURRETTAGE/HYSTEROSCOPY WITH RESECTOCOPE N/A 06/25/2012   Procedure: DILATATION & CURETTAGE/HYSTEROSCOPY WITH RESECTOCOPE;  Surgeon: Farrel Gobble. Harrington Challenger, MD;  Location: [facility];  Service: Gynecology;  Laterality: N/A;  . SHOULDER SURGERY     to remove bone spur 11-2007 Dr Tonita Cong  . TONSILLECTOMY    . WISDOM TOOTH EXTRACTION      Allergies  Allergen  Reactions  . Sulfonamide Derivatives Hives and Itching    Social History   Social History  . Marital status: Married    Spouse name: N/A  . Number of children: N/A  . Years of education: N/A   Occupational History  . Not on file.   Social History Main Topics  . Smoking status: Never Smoker  . Smokeless tobacco: Never Used  . Alcohol use 0.0 oz/week     Comment: occ  . Drug use: No  . Sexual activity: Yes    Birth control/ protection: None   Other Topics Concern  . Not on file   Social History Narrative  . No narrative on file    Family History  Problem Relation Age of Onset  . Migraines      family hx  . Hyperlipidemia      family hx  . Hypertension      family hx  . Prostate cancer      family hx 1st degree <50  . Heart disease Father   . Leukemia Paternal Uncle     BP 109/70   Pulse 79   Ht 5\' 6"  (1.676 m)   Wt 137 lb (62.1 kg)   LMP 02/11/2011   BMI 22.11 kg/m   Review of Systems: See HPI above.     Objective:  Physical Exam:  Gen: NAD, comfortable in exam room  Right  ankle: Mod lateral swelling, mild bruising.  No other gross deformity. Limited motion all directions. TTP distal fibula.  No fibular head, medial malleolus, base 5th, navicular, other foot/ankle tenderness. Negative syndesmotic compression. Thompsons test negative. NV intact distally.  Left ankle: FROM without pain.   Assessment & Plan:  1. Right ankle injury - independently reviewed radiographs - has small avulsion fracture of lateral malleolus.  Switch to cam walker.  Icing, elevation.  Crutches and weight bearing as tolerated.  Expect this to heal well clinically and should not need repeat radiographs.  Shown motion exercises to do twice a day.  Aleve or ibuprofen.  Tylenol with oxycodone at nighttime if needed.  F/u in 2 weeks.

## 2016-08-01 NOTE — Assessment & Plan Note (Signed)
independently reviewed radiographs - has small avulsion fracture of lateral malleolus.  Switch to cam walker.  Icing, elevation.  Crutches and weight bearing as tolerated.  Expect this to heal well clinically and should not need repeat radiographs.  Shown motion exercises to do twice a day.  Aleve or ibuprofen.  Tylenol with oxycodone at nighttime if needed.  F/u in 2 weeks.

## 2016-08-15 ENCOUNTER — Encounter: Payer: Self-pay | Admitting: Family Medicine

## 2016-08-15 ENCOUNTER — Ambulatory Visit (INDEPENDENT_AMBULATORY_CARE_PROVIDER_SITE_OTHER): Payer: BLUE CROSS/BLUE SHIELD | Admitting: Family Medicine

## 2016-08-15 DIAGNOSIS — S99911D Unspecified injury of right ankle, subsequent encounter: Secondary | ICD-10-CM

## 2016-08-15 NOTE — Patient Instructions (Signed)
You have a small avulsion fracture of your distal fibula. You are doing great! Ice the area for 15 minutes at a time, 3-4 times a day as needed. Elevate above the level of your heart when possible Lace-up brace when up and walking around. Ok to bear weight as tolerated with crutches. Ok to transition off the crutches when tolerated also. Start theraband strengthening exercises 3 sets of 10 once a day when comfortable. Follow up with me in 4 weeks.

## 2016-08-16 NOTE — Assessment & Plan Note (Signed)
small avulsion fracture of lateral malleolus.  Doing well clinically.  Switch to ASO from cam walker.  Ambulation as tolerated.  Transition off crutches as tolerated.  Icing, elevation as needed.  Shown home strengthening exercises to do when comfortable.  F/u in 4 weeks for reevaluation.  Tylenol only if needed.

## 2016-08-16 NOTE — Progress Notes (Signed)
PCP: Alysia Penna, MD  Subjective:   HPI: Patient is a 56 y.o. female here for right ankle injury.  4/18: Patient reports on 4/17 she stepped off a curb and inverted her right ankle. Immediate pain, difficulty bearing weight. Pain 3/10 and sharp lateral ankle now. Wearing sugar tong splint and using crutches. + swelling. Taking tylenol, has oxycodone for evening. She did sprain this ankle badly about 8 years ago. No skin changes, numbness.  5/3: Patient reports she is doing better. She gets some soreness if leg is sitting down a while. Using crutches and elevating. Not icing or needing any oral medicines. Using essential oils. Current pain is 0/10 laterally. Using boot as well and trying not to put weight on this. Slight swelling. Some bruising under foot. No other skin changes, no numbness.  Past Medical History:  Diagnosis Date  . Anemia    history  . Headache(784.0)   . Migraine   . SVD (spontaneous vaginal delivery)    x 1    Current Outpatient Prescriptions on File Prior to Visit  Medication Sig Dispense Refill  . HYDROcodone-acetaminophen (NORCO/VICODIN) 5-325 MG tablet Take 2 tablets by mouth every 4 (four) hours as needed. 14 tablet 0  . mupirocin ointment (BACTROBAN) 2 % Place 1 application into the nose 2 (two) times daily. (Patient not taking: Reported on 09/28/2014) 22 g 2  . OVER THE COUNTER MEDICATION Take 2 capsules by mouth daily. Cellular Vitality Complex    . OVER THE COUNTER MEDICATION Take 2 capsules by mouth daily. Food Nutrient Complex    . OVER THE COUNTER MEDICATION Take 2 capsules by mouth daily. Essential Oil Omega Complex    . Probiotic Product (PROBIOTIC DAILY PO) Take 1-2 capsules by mouth daily. Takes once or twice daily     No current facility-administered medications on file prior to visit.     Past Surgical History:  Procedure Laterality Date  . BREAST SURGERY     marker in left breast - watching an area  . DIAGNOSTIC LAPAROSCOPY      x 2  . DILATATION & CURRETTAGE/HYSTEROSCOPY WITH RESECTOCOPE N/A 06/25/2012   Procedure: DILATATION & CURETTAGE/HYSTEROSCOPY WITH RESECTOCOPE;  Surgeon: Farrel Gobble. Harrington Challenger, MD;  Location: [facility];  Service: Gynecology;  Laterality: N/A;  . SHOULDER SURGERY     to remove bone spur 11-2007 Dr Tonita Cong  . TONSILLECTOMY    . WISDOM TOOTH EXTRACTION      Allergies  Allergen Reactions  . Sulfonamide Derivatives Hives and Itching    Social History   Social History  . Marital status: Married    Spouse name: N/A  . Number of children: N/A  . Years of education: N/A   Occupational History  . Not on file.   Social History Main Topics  . Smoking status: Never Smoker  . Smokeless tobacco: Never Used  . Alcohol use 0.0 oz/week     Comment: occ  . Drug use: No  . Sexual activity: Yes    Birth control/ protection: None   Other Topics Concern  . Not on file   Social History Narrative  . No narrative on file    Family History  Problem Relation Age of Onset  . Migraines      family hx  . Hyperlipidemia      family hx  . Hypertension      family hx  . Prostate cancer      family hx 1st degree <50  . Heart disease Father   .  Leukemia Paternal Uncle     BP 107/66   Pulse 71   Ht 5\' 6"  (1.676 m)   Wt 137 lb (62.1 kg)   LMP 02/11/2011   BMI 22.11 kg/m   Review of Systems: See HPI above.     Objective:  Physical Exam:  Gen: NAD, comfortable in exam room  Right ankle: Mild lateral swelling, some plantar discoloration.  No other gross deformity. Mild limitation motion all directions, improved. No TTP distal fibula.  No fibular head, medial malleolus, base 5th, navicular, other foot/ankle tenderness. Negative syndesmotic compression. Thompsons test negative. NV intact distally.  Left ankle: FROM without pain.   Assessment & Plan:  1. Right ankle injury - small avulsion fracture of lateral malleolus.  Doing well clinically.  Switch to ASO from cam walker.  Ambulation as  tolerated.  Transition off crutches as tolerated.  Icing, elevation as needed.  Shown home strengthening exercises to do when comfortable.  F/u in 4 weeks for reevaluation.  Tylenol only if needed.

## 2016-08-27 ENCOUNTER — Encounter: Payer: Self-pay | Admitting: Family Medicine

## 2016-08-27 ENCOUNTER — Ambulatory Visit (INDEPENDENT_AMBULATORY_CARE_PROVIDER_SITE_OTHER): Payer: BLUE CROSS/BLUE SHIELD | Admitting: Family Medicine

## 2016-08-27 VITALS — BP 114/82 | HR 70 | Temp 98.5°F | Ht 66.0 in | Wt 136.0 lb

## 2016-08-27 DIAGNOSIS — J018 Other acute sinusitis: Secondary | ICD-10-CM | POA: Diagnosis not present

## 2016-08-27 MED ORDER — AZITHROMYCIN 250 MG PO TABS: ORAL_TABLET | ORAL | 0 refills | Status: DC

## 2016-08-27 MED ORDER — METHYLPREDNISOLONE 4 MG PO TBPK: ORAL_TABLET | ORAL | 0 refills | Status: DC

## 2016-08-27 NOTE — Patient Instructions (Signed)
WE NOW OFFER   [facility] FAST TRACK!!!  SAME DAY Appointments for ACUTE CARE  Such as: Sprains, Injuries, cuts, abrasions, rashes, muscle pain, joint pain, back pain Colds, flu, sore throats, headache, allergies, cough, fever  Ear pain, sinus and eye infections Abdominal pain, nausea, vomiting, diarrhea, upset stomach Animal/insect bites  3 Easy Ways to Schedule: Walk-In Scheduling Call in scheduling Mychart Sign-up: [email or web address]         

## 2016-08-27 NOTE — Progress Notes (Signed)
   Subjective:    Patient ID: Ayleah Hofmeister, female    DOB: 05/25/1960, 56 y.o.   MRN: [id number]  HPI Here for 3 weeks of stuffy head, pressure in both ears, and dizziness which is worse when she moves her head quickly. No headache.   Review of Systems  Constitutional: Negative.   HENT: Positive for ear pain, sinus pain and sinus pressure. Negative for congestion, facial swelling and sore throat.   Eyes: Negative.   Respiratory: Negative.   Neurological: Positive for dizziness. Negative for headaches.       Objective:   Physical Exam  Constitutional: She is oriented to person, place, and time. She appears well-developed and well-nourished.  HENT:  Right Ear: External ear normal.  Left Ear: External ear normal.  Nose: Nose normal.  Mouth/Throat: Oropharynx is clear and moist.  Eyes: Conjunctivae are normal.  Neck: No thyromegaly present.  Pulmonary/Chest: Effort normal and breath sounds normal.  Lymphadenopathy:    She has no cervical adenopathy.  Neurological: She is alert and oriented to person, place, and time. No cranial nerve deficit.          Assessment & Plan:  Vertigo secondary to a sinusitis. Treat with a Zpack and a Medrl dose pack. Add an OTC antihistamine daily.  Alysia Penna, MD

## 2016-09-16 ENCOUNTER — Ambulatory Visit (INDEPENDENT_AMBULATORY_CARE_PROVIDER_SITE_OTHER): Payer: BLUE CROSS/BLUE SHIELD | Admitting: Family Medicine

## 2016-09-16 ENCOUNTER — Encounter: Payer: Self-pay | Admitting: Family Medicine

## 2016-09-16 DIAGNOSIS — S99911D Unspecified injury of right ankle, subsequent encounter: Secondary | ICD-10-CM | POA: Diagnosis not present

## 2016-09-16 NOTE — Patient Instructions (Signed)
Continue the theraband strengthening exercises. Add the balance exercises. Start with single leg balance for 15 seconds, repeat 10 times once a day. When this is easy try the reaching forward balance exercise. Then can advance to the cone touch balance exercise. Finally can stand on pillow and do single leg balance. Consider physical therapy if you're not improving as expected. Follow up with me in 4 weeks for reevaluation. Ok to drive now. I would stop the brace also.

## 2016-09-16 NOTE — Assessment & Plan Note (Signed)
small avulsion fracture of lateral malleolus.  Soreness to touch likely from ASO diggint into this area of ankle.  Clinically doing well, no pain with ambulation.  Shown balance exercises to add to strengthening.  Declined physical therapy for now.  Tylenol, motrin only if needed.  Icing, elevation.  Stop ASO.  F/u prn.

## 2016-09-16 NOTE — Progress Notes (Signed)
PCP: Laurey Morale, MD  Subjective:   HPI: Patient is a 56 y.o. female here for right ankle injury.  4/18: Patient reports on 4/17 she stepped off a curb and inverted her right ankle. Immediate pain, difficulty bearing weight. Pain 3/10 and sharp lateral ankle now. Wearing sugar tong splint and using crutches. + swelling. Taking tylenol, has oxycodone for evening. She did sprain this ankle badly about 8 years ago. No skin changes, numbness.  5/3: Patient reports she is doing better. She gets some soreness if leg is sitting down a while. Using crutches and elevating. Not icing or needing any oral medicines. Using essential oils. Current pain is 0/10 laterally. Using boot as well and trying not to put weight on this. Slight swelling. Some bruising under foot. No other skin changes, no numbness.  6/4: Patient reports she is doing very well. Ankle feels tired but not hurting. Gets some swelling if on feet a lot. Takes a crutch with her when out of the house more for fear of not being able to get back to car if she walks a lot. Some soreness only to touch laterally. Wearing ASO, doing theraband strengthening. Pain level 0/10. No skin changes, numbness.  Past Medical History:  Diagnosis Date  . Anemia    history  . Headache(784.0)   . Migraine   . SVD (spontaneous vaginal delivery)    x 1    Current Outpatient Prescriptions on File Prior to Visit  Medication Sig Dispense Refill  . azithromycin (ZITHROMAX) 250 MG tablet As directed 6 tablet 0  . methylPREDNISolone (MEDROL DOSEPAK) 4 MG TBPK tablet As directed 21 tablet 0  . OVER THE COUNTER MEDICATION Take 2 capsules by mouth daily. Cellular Vitality Complex    . OVER THE COUNTER MEDICATION Take 2 capsules by mouth daily. Food Nutrient Complex    . OVER THE COUNTER MEDICATION Take 2 capsules by mouth daily. Essential Oil Omega Complex    . Probiotic Product (PROBIOTIC DAILY PO) Take 1-2 capsules by mouth daily. Takes  once or twice daily     No current facility-administered medications on file prior to visit.     Past Surgical History:  Procedure Laterality Date  . BREAST SURGERY     marker in left breast - watching an area  . DIAGNOSTIC LAPAROSCOPY     x 2  . DILATATION & CURRETTAGE/HYSTEROSCOPY WITH RESECTOCOPE N/A 06/25/2012   Procedure: DILATATION & CURETTAGE/HYSTEROSCOPY WITH RESECTOCOPE;  Surgeon: Farrel Gobble. Harrington Challenger, MD;  Location: [facility];  Service: Gynecology;  Laterality: N/A;  . SHOULDER SURGERY     to remove bone spur 11-2007 Dr Tonita Cong  . TONSILLECTOMY    . WISDOM TOOTH EXTRACTION      Allergies  Allergen Reactions  . Sulfonamide Derivatives Hives and Itching    Social History   Social History  . Marital status: Married    Spouse name: N/A  . Number of children: N/A  . Years of education: N/A   Occupational History  . Not on file.   Social History Main Topics  . Smoking status: Never Smoker  . Smokeless tobacco: Never Used  . Alcohol use 0.0 oz/week     Comment: occ  . Drug use: No  . Sexual activity: Yes    Birth control/ protection: None   Other Topics Concern  . Not on file   Social History Narrative  . No narrative on file    Family History  Problem Relation Age of Onset  .  Migraines Unknown        family hx  . Hyperlipidemia Unknown        family hx  . Hypertension Unknown        family hx  . Prostate cancer Unknown        family hx 1st degree <50  . Heart disease Father   . Leukemia Paternal Uncle     BP 108/68   Pulse 66   Ht 5\' 6"  (1.676 m)   Wt 135 lb (61.2 kg)   LMP 02/11/2011   BMI 21.79 kg/m   Review of Systems: See HPI above.     Objective:  Physical Exam:  Gen: NAD, comfortable in exam room  Right ankle: No swelling, bruising, other gross deformity. FROM. Mild TTP distal fibula but none at tip where fracture is.  No fibular head, medial malleolus, base 5th, navicular, other foot/ankle tenderness. Negative syndesmotic  compression. Thompsons test negative. NV intact distally.  Left ankle: FROM without pain.   Assessment & Plan:  1. Right ankle injury - small avulsion fracture of lateral malleolus.  Soreness to touch likely from ASO diggint into this area of ankle.  Clinically doing well, no pain with ambulation.  Shown balance exercises to add to strengthening.  Declined physical therapy for now.  Tylenol, motrin only if needed.  Icing, elevation.  Stop ASO.  F/u prn.

## 2016-10-14 ENCOUNTER — Ambulatory Visit (INDEPENDENT_AMBULATORY_CARE_PROVIDER_SITE_OTHER): Payer: BLUE CROSS/BLUE SHIELD | Admitting: Family Medicine

## 2016-10-14 ENCOUNTER — Encounter: Payer: Self-pay | Admitting: Family Medicine

## 2016-10-14 DIAGNOSIS — S99911D Unspecified injury of right ankle, subsequent encounter: Secondary | ICD-10-CM | POA: Diagnosis not present

## 2016-10-14 NOTE — Patient Instructions (Signed)
I suspect you have some ankle impingement from scar tissue in the ankle. This should resolve over time. Continue wearing shoes with good arch support. Continue the home exercises with theraband for another 4-6 weeks most days of the week. Would consider MRI if you continue to have problems over the next 6 weeks. Injection, physical therapy, surgical intervention are considerations though unlikely to be needed.

## 2016-10-14 NOTE — Progress Notes (Signed)
PCP: Laurey Morale, MD  Subjective:   HPI: Patient is a 56 y.o. female here for right ankle injury.  4/18: Patient reports on 4/17 she stepped off a curb and inverted her right ankle. Immediate pain, difficulty bearing weight. Pain 3/10 and sharp lateral ankle now. Wearing sugar tong splint and using crutches. + swelling. Taking tylenol, has oxycodone for evening. She did sprain this ankle badly about 8 years ago. No skin changes, numbness.  5/3: Patient reports she is doing better. She gets some soreness if leg is sitting down a while. Using crutches and elevating. Not icing or needing any oral medicines. Using essential oils. Current pain is 0/10 laterally. Using boot as well and trying not to put weight on this. Slight swelling. Some bruising under foot. No other skin changes, no numbness.  6/4: Patient reports she is doing very well. Ankle feels tired but not hurting. Gets some swelling if on feet a lot. Takes a crutch with her when out of the house more for fear of not being able to get back to car if she walks a lot. Some soreness only to touch laterally. Wearing ASO, doing theraband strengthening. Pain level 0/10. No skin changes, numbness.  7/2: Patient reports she is doing well. She struggled the first couple weeks and had to wear brace. Whole foot was sore but this has improved. Doing home exercises and balance exercises. Has a little pain at times and will feel the ankle catch if she tries to roll the ankle. Pain level now 0/10. No skin changes, numbness. She also kicked a rocking chair on accident the other day.   Past Medical History:  Diagnosis Date  . Anemia    history  . Headache(784.0)   . Migraine   . SVD (spontaneous vaginal delivery)    x 1    Current Outpatient Prescriptions on File Prior to Visit  Medication Sig Dispense Refill  . azithromycin (ZITHROMAX) 250 MG tablet As directed 6 tablet 0  . methylPREDNISolone (MEDROL DOSEPAK) 4  MG TBPK tablet As directed 21 tablet 0  . OVER THE COUNTER MEDICATION Take 2 capsules by mouth daily. Cellular Vitality Complex    . OVER THE COUNTER MEDICATION Take 2 capsules by mouth daily. Food Nutrient Complex    . OVER THE COUNTER MEDICATION Take 2 capsules by mouth daily. Essential Oil Omega Complex    . Probiotic Product (PROBIOTIC DAILY PO) Take 1-2 capsules by mouth daily. Takes once or twice daily     No current facility-administered medications on file prior to visit.     Past Surgical History:  Procedure Laterality Date  . BREAST SURGERY     marker in left breast - watching an area  . DIAGNOSTIC LAPAROSCOPY     x 2  . DILATATION & CURRETTAGE/HYSTEROSCOPY WITH RESECTOCOPE N/A 06/25/2012   Procedure: DILATATION & CURETTAGE/HYSTEROSCOPY WITH RESECTOCOPE;  Surgeon: Farrel Gobble. Harrington Challenger, MD;  Location: [facility];  Service: Gynecology;  Laterality: N/A;  . SHOULDER SURGERY     to remove bone spur 11-2007 Dr Tonita Cong  . TONSILLECTOMY    . WISDOM TOOTH EXTRACTION      Allergies  Allergen Reactions  . Sulfonamide Derivatives Hives and Itching    Social History   Social History  . Marital status: Married    Spouse name: N/A  . Number of children: N/A  . Years of education: N/A   Occupational History  . Not on file.   Social History Main Topics  . Smoking  status: Never Smoker  . Smokeless tobacco: Never Used  . Alcohol use 0.0 oz/week     Comment: occ  . Drug use: No  . Sexual activity: Yes    Birth control/ protection: None   Other Topics Concern  . Not on file   Social History Narrative  . No narrative on file    Family History  Problem Relation Age of Onset  . Migraines Unknown        family hx  . Hyperlipidemia Unknown        family hx  . Hypertension Unknown        family hx  . Prostate cancer Unknown        family hx 1st degree <50  . Heart disease Father   . Leukemia Paternal Uncle     BP 108/69   Pulse (!) 58   Ht 5\' 6"  (1.676 m)   Wt 135 lb (61.2  kg)   LMP 02/11/2011   BMI 21.79 kg/m   Review of Systems: See HPI above.     Objective:  Physical Exam:  Gen: NAD, comfortable in exam room  Right ankle: No swelling, bruising, other gross deformity. FROM. Strength 5/5 all motions. No TTP including distal fibula.   Negative syndesmotic compression. Negative ant drawer and talar tilt. Thompsons test negative. NV intact distally.  Left ankle: FROM without pain.   Assessment & Plan:  1. Right ankle injury - small avulsion fracture of lateral malleolus.  Clinically improved.  Having some catching with ankle circles - suspect some impingement.  Continue home exercises.  Arch support.  Icing, tylenol if needed.  F/u in 6 weeks if not improving.  Consider MRI.

## 2016-10-14 NOTE — Assessment & Plan Note (Signed)
small avulsion fracture of lateral malleolus.  Clinically improved.  Having some catching with ankle circles - suspect some impingement.  Continue home exercises.  Arch support.  Icing, tylenol if needed.  F/u in 6 weeks if not improving.  Consider MRI.

## 2016-11-25 ENCOUNTER — Ambulatory Visit (INDEPENDENT_AMBULATORY_CARE_PROVIDER_SITE_OTHER): Payer: BLUE CROSS/BLUE SHIELD | Admitting: Family Medicine

## 2016-11-25 ENCOUNTER — Encounter: Payer: Self-pay | Admitting: Family Medicine

## 2016-11-25 DIAGNOSIS — S99911D Unspecified injury of right ankle, subsequent encounter: Secondary | ICD-10-CM

## 2016-11-26 NOTE — Progress Notes (Signed)
PCP: Laurey Morale, MD  Subjective:   HPI: Patient is a 56 y.o. female here for right ankle injury.  4/18: Patient reports on 4/17 she stepped off a curb and inverted her right ankle. Immediate pain, difficulty bearing weight. Pain 3/10 and sharp lateral ankle now. Wearing sugar tong splint and using crutches. + swelling. Taking tylenol, has oxycodone for evening. She did sprain this ankle badly about 8 years ago. No skin changes, numbness.  5/3: Patient reports she is doing better. She gets some soreness if leg is sitting down a while. Using crutches and elevating. Not icing or needing any oral medicines. Using essential oils. Current pain is 0/10 laterally. Using boot as well and trying not to put weight on this. Slight swelling. Some bruising under foot. No other skin changes, no numbness.  6/4: Patient reports she is doing very well. Ankle feels tired but not hurting. Gets some swelling if on feet a lot. Takes a crutch with her when out of the house more for fear of not being able to get back to car if she walks a lot. Some soreness only to touch laterally. Wearing ASO, doing theraband strengthening. Pain level 0/10. No skin changes, numbness.  7/2: Patient reports she is doing well. She struggled the first couple weeks and had to wear brace. Whole foot was sore but this has improved. Doing home exercises and balance exercises. Has a little pain at times and will feel the ankle catch if she tries to roll the ankle. Pain level now 0/10. No skin changes, numbness. She also kicked a rocking chair on accident the other day.  8/13: Patient reports she's doing extremely well. Is over98% improved now. Is doing yoga and back exercising. Wearing ankle brace with exercise. Pain level 0/10. She is elevating still because ankle will swell at times. No issues with daily activities. No skin changes.  Past Medical History:  Diagnosis Date  . Anemia    history  .  Headache(784.0)   . Migraine   . SVD (spontaneous vaginal delivery)    x 1    Current Outpatient Prescriptions on File Prior to Visit  Medication Sig Dispense Refill  . azithromycin (ZITHROMAX) 250 MG tablet As directed 6 tablet 0  . methylPREDNISolone (MEDROL DOSEPAK) 4 MG TBPK tablet As directed 21 tablet 0  . OVER THE COUNTER MEDICATION Take 2 capsules by mouth daily. Cellular Vitality Complex    . OVER THE COUNTER MEDICATION Take 2 capsules by mouth daily. Food Nutrient Complex    . OVER THE COUNTER MEDICATION Take 2 capsules by mouth daily. Essential Oil Omega Complex    . Probiotic Product (PROBIOTIC DAILY PO) Take 1-2 capsules by mouth daily. Takes once or twice daily     No current facility-administered medications on file prior to visit.     Past Surgical History:  Procedure Laterality Date  . BREAST SURGERY     marker in left breast - watching an area  . DIAGNOSTIC LAPAROSCOPY     x 2  . DILATATION & CURRETTAGE/HYSTEROSCOPY WITH RESECTOCOPE N/A 06/25/2012   Procedure: DILATATION & CURETTAGE/HYSTEROSCOPY WITH RESECTOCOPE;  Surgeon: Farrel Gobble. Harrington Challenger, MD;  Location: [facility];  Service: Gynecology;  Laterality: N/A;  . SHOULDER SURGERY     to remove bone spur 11-2007 Dr Tonita Cong  . TONSILLECTOMY    . WISDOM TOOTH EXTRACTION      Allergies  Allergen Reactions  . Sulfonamide Derivatives Hives and Itching    Social History  Social History  . Marital status: Married    Spouse name: N/A  . Number of children: N/A  . Years of education: N/A   Occupational History  . Not on file.   Social History Main Topics  . Smoking status: Never Smoker  . Smokeless tobacco: Never Used  . Alcohol use 0.0 oz/week     Comment: occ  . Drug use: No  . Sexual activity: Yes    Birth control/ protection: None   Other Topics Concern  . Not on file   Social History Narrative  . No narrative on file    Family History  Problem Relation Age of Onset  . Migraines Unknown        family  hx  . Hyperlipidemia Unknown        family hx  . Hypertension Unknown        family hx  . Prostate cancer Unknown        family hx 1st degree <50  . Heart disease Father   . Leukemia Paternal Uncle     BP 103/70   Pulse 72   Ht 5\' 6"  (1.676 m)   Wt 125 lb (56.7 kg)   LMP 02/11/2011   BMI 20.18 kg/m   Review of Systems: See HPI above.     Objective:  Physical Exam:  Gen: NAD, comfortable in exam room  Right ankle: No swelling, bruising, other gross deformity. FROM. Strength 5/5 all motions. No TTP including distal fibula.   Negative syndesmotic compression. Negative ant drawer and talar tilt. Thompsons test negative. NV intact distally.  Left ankle: FROM without pain.   Assessment & Plan:  1. Right ankle injury - s/p small avulsion fracture of lateral malleolus that has healed clinically.  Doing well with home exercises - only issue is residual swelling which we discussed can take months to resolve.  Arch support.  Icing, tylenol if needed.  F/u prn.

## 2016-11-26 NOTE — Assessment & Plan Note (Signed)
s/p small avulsion fracture of lateral malleolus that has healed clinically.  Doing well with home exercises - only issue is residual swelling which we discussed can take months to resolve.  Arch support.  Icing, tylenol if needed.  F/u prn.

## 2017-01-02 ENCOUNTER — Encounter: Payer: Self-pay | Admitting: Family Medicine

## 2017-02-20 ENCOUNTER — Other Ambulatory Visit: Payer: Self-pay | Admitting: Obstetrics and Gynecology

## 2017-02-20 DIAGNOSIS — Z1231 Encounter for screening mammogram for malignant neoplasm of breast: Secondary | ICD-10-CM

## 2017-03-26 ENCOUNTER — Ambulatory Visit
Admission: RE | Admit: 2017-03-26 | Discharge: 2017-03-26 | Disposition: A | Discharge status: Home / Self Care | Payer: BLUE CROSS/BLUE SHIELD | Source: Ambulatory Visit | Attending: Obstetrics and Gynecology | Admitting: Obstetrics and Gynecology

## 2017-03-26 DIAGNOSIS — Z1231 Encounter for screening mammogram for malignant neoplasm of breast: Secondary | ICD-10-CM

## 2017-04-30 ENCOUNTER — Encounter: Payer: Self-pay | Admitting: Family Medicine

## 2017-04-30 ENCOUNTER — Ambulatory Visit (INDEPENDENT_AMBULATORY_CARE_PROVIDER_SITE_OTHER): Payer: BLUE CROSS/BLUE SHIELD | Admitting: Family Medicine

## 2017-04-30 VITALS — BP 98/60 | HR 78 | Temp 98.1°F | Wt 138.4 lb

## 2017-04-30 DIAGNOSIS — R413 Other amnesia: Secondary | ICD-10-CM | POA: Diagnosis not present

## 2017-04-30 DIAGNOSIS — Z833 Family history of diabetes mellitus: Secondary | ICD-10-CM

## 2017-04-30 DIAGNOSIS — L989 Disorder of the skin and subcutaneous tissue, unspecified: Secondary | ICD-10-CM

## 2017-04-30 LAB — LIPID PANEL
CHOL/HDL RATIO: 3
CHOLESTEROL: 222 mg/dL — AB (ref 0–200)
HDL: 69.1 mg/dL (ref >39.00)
LDL Cholesterol: 134 mg/dL — ABNORMAL HIGH (ref 0–99)
NonHDL: 152.42
Triglycerides: 90 mg/dL (ref 0.0–149.0)
VLDL: 18 mg/dL (ref 0.0–40.0)

## 2017-04-30 LAB — HEMOGLOBIN A1C: Hgb A1c MFr Bld: 5.7 % (ref 4.6–6.5)

## 2017-04-30 LAB — BASIC METABOLIC PANEL
BUN: 15 mg/dL (ref 6–23)
CALCIUM: 9.5 mg/dL (ref 8.4–10.5)
CHLORIDE: 102 meq/L (ref 96–112)
CO2: 31 mEq/L (ref 19–32)
CREATININE: 0.77 mg/dL (ref 0.40–1.20)
GFR: 81.65 mL/min (ref >60.00)
Glucose, Bld: 89 mg/dL (ref 70–99)
Potassium: 4.1 mEq/L (ref 3.5–5.1)
Sodium: 141 mEq/L (ref 135–145)

## 2017-04-30 LAB — CBC WITH DIFFERENTIAL/PLATELET
Basophils Absolute: 0 10*3/uL (ref 0.0–0.1)
Basophils Relative: 0.8 % (ref 0.0–3.0)
EOS ABS: 0.1 10*3/uL (ref 0.0–0.7)
Eosinophils Relative: 2.7 % (ref 0.0–5.0)
HEMATOCRIT: 42.1 % (ref 36.0–46.0)
HEMOGLOBIN: 14 g/dL (ref 12.0–15.0)
LYMPHS PCT: 44.5 % (ref 12.0–46.0)
Lymphs Abs: 1.9 10*3/uL (ref 0.7–4.0)
MCHC: 33.2 g/dL (ref 30.0–36.0)
MCV: 93.8 fl (ref 78.0–100.0)
MONO ABS: 0.3 10*3/uL (ref 0.1–1.0)
Monocytes Relative: 6.6 % (ref 3.0–12.0)
Neutro Abs: 1.9 10*3/uL (ref 1.4–7.7)
Neutrophils Relative %: 45.4 % (ref 43.0–77.0)
Platelets: 278 10*3/uL (ref 150.0–400.0)
RBC: 4.49 Mil/uL (ref 3.87–5.11)
RDW: 13 % (ref 11.5–15.5)
WBC: 4.3 10*3/uL (ref 4.0–10.5)

## 2017-04-30 LAB — HEPATIC FUNCTION PANEL
ALBUMIN: 4.2 g/dL (ref 3.5–5.2)
ALK PHOS: 74 U/L (ref 39–117)
ALT: 25 U/L (ref 0–35)
AST: 20 U/L (ref 0–37)
Bilirubin, Direct: 0.1 mg/dL (ref 0.0–0.3)
TOTAL PROTEIN: 6.9 g/dL (ref 6.0–8.3)
Total Bilirubin: 0.4 mg/dL (ref 0.2–1.2)

## 2017-04-30 LAB — FOLATE

## 2017-04-30 LAB — CORTISOL: CORTISOL PLASMA: 6.7 ug/dL

## 2017-04-30 LAB — C-REACTIVE PROTEIN: CRP: 0.1 mg/dL — ABNORMAL LOW (ref 0.5–20.0)

## 2017-04-30 LAB — T4, FREE: Free T4: 0.86 ng/dL (ref 0.60–1.60)

## 2017-04-30 LAB — ALBUMIN: ALBUMIN: 4.2 g/dL (ref 3.5–5.2)

## 2017-04-30 LAB — TSH: TSH: 2.12 u[IU]/mL (ref 0.35–4.50)

## 2017-04-30 LAB — T3, FREE: T3 FREE: 3.1 pg/mL (ref 2.3–4.2)

## 2017-04-30 LAB — VITAMIN B12: VITAMIN B 12: 632 pg/mL (ref 211–911)

## 2017-04-30 NOTE — Progress Notes (Signed)
   Subjective:    Patient ID: Brittany Bartlett, female    DOB: September 27, 1960, 57 y.o.   MRN: [id number]  HPI Here for several issues. First she has had a sore spot on the scalp for the past 2 years, and this occasionally gets quite tender. She applies antibiotic ointment to it and it scabs up for awhile, then it settles down again. She had mad e an appt to see [facility] in March for a general skin exam. Also is concerned about the possibility of developing a dementia of some sort. Her husband has developed some memory problems and she has been researching the Internet about dementia. She has no family history of this but she is very anxious to prevent this in the future. She asks [place] to check a number of tests today.    Review of Systems  Constitutional: Negative.   Respiratory: Negative.   Cardiovascular: Negative.   Neurological: Negative.        Objective:   Physical Exam  Constitutional: She is oriented to person, place, and time. She appears well-developed and well-nourished.  Cardiovascular: Normal rate, regular rhythm, normal heart sounds and intact distal pulses.  Pulmonary/Chest: Effort normal and breath sounds normal. No respiratory distress. She has no wheezes. She has no rales.  Neurological: She is alert and oriented to person, place, and time.  Skin:  The vertex of the scalp has a large 1.0 cm scabbed lesion which is tender. It is difficult to assess due to the scabbing          Assessment & Plan:  The scalp lesion is concerning as a possible neoplasm, so we will refer her to the [facility] to remove it. As for the pre-dementia tests we will order a number of the ones she suggests today.  Alysia Penna, MD

## 2017-05-05 ENCOUNTER — Telehealth: Payer: Self-pay | Admitting: Family Medicine

## 2017-05-05 NOTE — Telephone Encounter (Signed)
Spoke with Brittany Bartlett and this is soonest NP appt available. Spoke with Brittany Bartlett and advised. Also advised she can call [facility] office to check their scheduling. She will call them. Nothing further needed at this time.

## 2017-05-05 NOTE — Telephone Encounter (Signed)
Copied from [facility] #[id number]. Topic: General - Other >> May 05, 2017  1:49 PM Darl Householder, RMA wrote: Reason for CRM: patient is requesting a callback concerning dermatology referral, please resend referral as a stat due to pt appt is not until 07/09/17 and she needs to be seen sooner, please call pt at [phone]

## 2017-05-08 LAB — VITAMIN B1: Vitamin B1 (Thiamine): 25 nmol/L (ref 8–30)

## 2017-05-08 LAB — T3, REVERSE: T3 REVERSE: 11 ng/dL (ref 8–25)

## 2017-05-08 LAB — VITAMIN B6: Vitamin B6: 25.8 ng/mL — ABNORMAL HIGH (ref 2.1–21.7)

## 2017-05-08 LAB — INSULIN, FREE (BIOACTIVE): Insulin, Free: 5.9 u[IU]/mL (ref 1.5–14.9)

## 2017-05-08 LAB — ZINC

## 2017-05-08 LAB — HOMOCYSTEINE: Homocysteine: 8.4 umol/L (ref <10.4)

## 2017-05-08 LAB — VITAMIN E: Vitamin E (Alpha Tocopherol): 20.8 mg/L — ABNORMAL HIGH (ref 5.7–19.9)

## 2017-05-08 LAB — METHYLMALONIC ACID(MMA), RND URINE
CREATININE RANDOM UR MMA: 7.11 mmol/L (ref 1.77–23.31)
Methylmalonic acid: 0.9 mmol/mol{creat} (ref 0.3–2.2)

## 2017-05-08 LAB — EXTRA URINE SPECIMEN

## 2017-05-08 LAB — CERULOPLASMIN: CERULOPLASMIN: 28 mg/dL (ref 18–53)

## 2018-02-23 ENCOUNTER — Other Ambulatory Visit: Payer: Self-pay | Admitting: Obstetrics and Gynecology

## 2018-02-23 DIAGNOSIS — Z1231 Encounter for screening mammogram for malignant neoplasm of breast: Secondary | ICD-10-CM

## 2018-04-17 ENCOUNTER — Ambulatory Visit
Admission: RE | Admit: 2018-04-17 | Discharge: 2018-04-17 | Disposition: A | Discharge status: Home / Self Care | Payer: BLUE CROSS/BLUE SHIELD | Source: Ambulatory Visit | Attending: Obstetrics and Gynecology | Admitting: Obstetrics and Gynecology

## 2018-04-17 DIAGNOSIS — Z1231 Encounter for screening mammogram for malignant neoplasm of breast: Secondary | ICD-10-CM

## 2019-03-04 ENCOUNTER — Other Ambulatory Visit: Payer: Self-pay | Admitting: Obstetrics and Gynecology

## 2019-03-04 DIAGNOSIS — Z1231 Encounter for screening mammogram for malignant neoplasm of breast: Secondary | ICD-10-CM

## 2019-04-29 ENCOUNTER — Ambulatory Visit
Admission: RE | Admit: 2019-04-29 | Discharge: 2019-04-29 | Disposition: A | Discharge status: Home / Self Care | Payer: BC Managed Care – PPO | Source: Ambulatory Visit | Attending: Obstetrics and Gynecology | Admitting: Obstetrics and Gynecology

## 2019-04-29 ENCOUNTER — Other Ambulatory Visit: Payer: Self-pay

## 2019-04-29 DIAGNOSIS — Z1231 Encounter for screening mammogram for malignant neoplasm of breast: Secondary | ICD-10-CM

## 2019-08-06 ENCOUNTER — Encounter: Payer: Self-pay | Admitting: Gastroenterology

## 2019-09-01 ENCOUNTER — Ambulatory Visit (AMBULATORY_SURGERY_CENTER): Payer: Self-pay | Admitting: *Deleted

## 2019-09-01 ENCOUNTER — Other Ambulatory Visit: Payer: Self-pay

## 2019-09-01 ENCOUNTER — Telehealth: Payer: Self-pay | Admitting: Family Medicine

## 2019-09-01 VITALS — Temp 97.5°F | Ht 66.0 in | Wt 142.0 lb

## 2019-09-01 DIAGNOSIS — Z01818 Encounter for other preprocedural examination: Secondary | ICD-10-CM

## 2019-09-01 DIAGNOSIS — Z1211 Encounter for screening for malignant neoplasm of colon: Secondary | ICD-10-CM

## 2019-09-01 MED ORDER — SUPREP BOWEL PREP KIT 17.5-3.13-1.6 GM/177ML PO SOLN: 1.0000 | Freq: Once | ORAL | 0 refills | Status: AC

## 2019-09-01 NOTE — Telephone Encounter (Signed)
Pt would like to have a cologuard test instead of doing the Colonoscopy. Please advise.

## 2019-09-01 NOTE — Progress Notes (Signed)
No egg or soy allergy known to patient  No issues with past sedation with any surgeries  or procedures, no intubation problems  No diet pills per patient No home 02 use per patient  No blood thinners per patient  Pt denies issues with constipation  No A fib or A flutter  EMMI video sent to pt's e mail   Due to the COVID-19 pandemic we are asking patients to follow these guidelines. Please only bring one care partner. Please be aware that your care partner may wait in the car in the parking lot or if they feel like they will be too hot to wait in the car, they may wait in the lobby on the 4th floor. All care partners are required to wear a mask the entire time (we do not have any that we can provide them), they need to practice social distancing, and we will do a Covid check for all patient's and care partners when you arrive. Also we will check their temperature and your temperature. If the care partner waits in their car they need to stay in the parking lot the entire time and we will call them on their cell phone when the patient is ready for discharge so they can bring the car to the front of the building. Also all patient's will need to wear a mask into building.  SUPREP CODE AND COUPON   PT WAS UPSET IN [facility] ABOUT HAVING TO HAVE A COVID TEST PRIOR TO HER COLON 6-2- SHE IS NOT VACCINATED- SHE ASKED DID SHE HAVE TO COVID TEST - Explained to her yes and why- she asked about colo guard- told her she can do that thru dr fry's office- she states she will think about this and cb to cancel if she decides tonot do the colonoscopy

## 2019-09-01 NOTE — Telephone Encounter (Signed)
The GI office is already in the process of setting up the colonoscopy. Tell her to contact Dr. Bryan Lemma about this and he can order the Cologuard if appropriate

## 2019-09-01 NOTE — Telephone Encounter (Signed)
Patient spoke with GI and was told that we would have to order this.

## 2019-09-01 NOTE — Telephone Encounter (Signed)
Please cancel the colonoscopy and order a Cologuard

## 2019-09-15 ENCOUNTER — Encounter: Payer: BC Managed Care – PPO | Admitting: Gastroenterology

## 2019-09-24 ENCOUNTER — Other Ambulatory Visit: Payer: Self-pay

## 2019-09-24 ENCOUNTER — Telehealth: Payer: Self-pay | Admitting: Family Medicine

## 2019-09-24 DIAGNOSIS — Z1211 Encounter for screening for malignant neoplasm of colon: Secondary | ICD-10-CM

## 2019-09-24 NOTE — Telephone Encounter (Signed)
Left a detailed message on verified voice mail informing the patient that the original order had been closed. I have re-ordered the cologuard for her and if she has not received this by next Wednesday to let me know.

## 2019-09-24 NOTE — Telephone Encounter (Signed)
Pt is calling stating that she has not received her cologuard kit and would like to know how much longer will it be before she receives it.  Pt stated that she received a mychart message back in May stating that it would be within a week and it well over the week.

## 2019-10-09 LAB — COLOGUARD: Cologuard: NEGATIVE

## 2019-10-21 ENCOUNTER — Telehealth: Payer: Self-pay

## 2019-10-21 ENCOUNTER — Encounter: Payer: Self-pay | Admitting: Family Medicine

## 2019-10-21 NOTE — Telephone Encounter (Signed)
Called pt to advised that Cologuard results were neg. Tried to leave a vm but was called into a pt rm.

## 2019-10-22 NOTE — Telephone Encounter (Signed)
Left message to return phone call. Closing note. 

## 2019-11-10 ENCOUNTER — Encounter: Payer: Self-pay | Admitting: Family Medicine

## 2019-11-10 ENCOUNTER — Other Ambulatory Visit: Payer: Self-pay

## 2019-11-10 ENCOUNTER — Ambulatory Visit (INDEPENDENT_AMBULATORY_CARE_PROVIDER_SITE_OTHER): Payer: BC Managed Care – PPO | Admitting: Family Medicine

## 2019-11-10 VITALS — BP 120/64 | HR 85 | Temp 98.6°F | Wt 147.0 lb

## 2019-11-10 DIAGNOSIS — Z0000 Encounter for general adult medical examination without abnormal findings: Secondary | ICD-10-CM | POA: Diagnosis not present

## 2019-11-10 MED ORDER — TOBRAMYCIN-DEXAMETHASONE 0.3-0.1 % OP SUSP
2.0000 [drp] | OPHTHALMIC | 0 refills | Status: DC
Start: 2019-11-10 — End: 2021-01-12

## 2019-11-10 NOTE — Progress Notes (Signed)
Subjective:    Patient ID: Brittany Bartlett, female    DOB: [birth year], 59 y.o.   MRN: [id number]  HPI Here for a well exam. She has a few issues to discuss. First for the past 2 days she has had burning and redness in the left eye. She does not wear contacts. Also for several months she has had easy bleeding or bruising with the slightest trauma, especially on the forearms when she works out in her yard.    Review of Systems  Constitutional: Negative.   HENT: Negative.   Eyes: Negative.   Respiratory: Negative.   Cardiovascular: Negative.   Gastrointestinal: Negative.   Genitourinary: Negative for decreased urine volume, difficulty urinating, dyspareunia, dysuria, enuresis, flank pain, frequency, hematuria, pelvic pain and urgency.  Musculoskeletal: Negative.   Skin: Negative.   Neurological: Negative.   Psychiatric/Behavioral: Negative.        Objective:   Physical Exam Constitutional:      General: She is not in acute distress.    Appearance: She is well-developed.  HENT:     Head: Normocephalic and atraumatic.     Right Ear: External ear normal.     Left Ear: External ear normal.     Nose: Nose normal.     Mouth/Throat:     Pharynx: No oropharyngeal exudate.  Eyes:     General: No scleral icterus.       Right eye: No discharge.        Left eye: No discharge.     Conjunctiva/sclera: Conjunctivae normal.     Pupils: Pupils are equal, round, and reactive to light.  Neck:     Thyroid: No thyromegaly.     Vascular: No JVD.  Cardiovascular:     Rate and Rhythm: Normal rate and regular rhythm.     Heart sounds: Normal heart sounds. No murmur heard.  No friction rub. No gallop.   Pulmonary:     Effort: Pulmonary effort is normal. No respiratory distress.     Breath sounds: Normal breath sounds. No wheezing or rales.  Chest:     Chest wall: No tenderness.  Abdominal:     General: Bowel sounds are normal. There is no distension.     Palpations: Abdomen is soft. There is  no mass.     Tenderness: There is no abdominal tenderness. There is no guarding or rebound.  Musculoskeletal:        General: No tenderness. Normal range of motion.     Cervical back: Normal range of motion and neck supple.  Lymphadenopathy:     Cervical: No cervical adenopathy.  Skin:    General: Skin is warm and dry.     Findings: No erythema or rash.  Neurological:     Mental Status: She is alert and oriented to person, place, and time.     Cranial Nerves: No cranial nerve deficit.     Motor: No abnormal muscle tone.     Coordination: Coordination normal.     Deep Tendon Reflexes: Reflexes are normal and symmetric. Reflexes normal.  Psychiatric:        Behavior: Behavior normal.        Thought Content: Thought content normal.        Judgment: Judgment normal.           Assessment & Plan:  Well exam. We discussed diet and exercise. Get fasting labs. She may have an early pinkeye so we will treat with Tobradex drops.  Alysia Penna, MD

## 2019-11-11 LAB — LIPID PANEL
Cholesterol: 236 mg/dL — ABNORMAL HIGH (ref <200)
HDL: 72 mg/dL (ref >=50)
LDL Cholesterol (Calc): 146 mg/dL (calc) — ABNORMAL HIGH
Non-HDL Cholesterol (Calc): 164 mg/dL (calc) — ABNORMAL HIGH (ref <130)
Total CHOL/HDL Ratio: 3.3 (calc) (ref <5.0)
Triglycerides: 80 mg/dL (ref <150)

## 2019-11-11 LAB — BASIC METABOLIC PANEL
BUN: 15 mg/dL (ref 7–25)
CO2: 30 mmol/L (ref 20–32)
Calcium: 9.5 mg/dL (ref 8.6–10.4)
Chloride: 104 mmol/L (ref 98–110)
Creat: 0.8 mg/dL (ref 0.50–0.99)
Glucose, Bld: 93 mg/dL (ref 65–99)
Potassium: 4.2 mmol/L (ref 3.5–5.3)
Sodium: 141 mmol/L (ref 135–146)

## 2019-11-11 LAB — CBC WITH DIFFERENTIAL/PLATELET
Absolute Monocytes: 369 cells/uL (ref 200–950)
Basophils Absolute: 41 cells/uL (ref 0–200)
Basophils Relative: 0.9 %
Eosinophils Absolute: 81 cells/uL (ref 15–500)
Eosinophils Relative: 1.8 %
HCT: 43.9 % (ref 35.0–45.0)
Hemoglobin: 14.4 g/dL (ref 11.7–15.5)
Lymphs Abs: 1796 cells/uL (ref 850–3900)
MCH: 30.6 pg (ref 27.0–33.0)
MCHC: 32.8 g/dL (ref 32.0–36.0)
MCV: 93.2 fL (ref 80.0–100.0)
MPV: 9.5 fL (ref 7.5–12.5)
Monocytes Relative: 8.2 %
Neutro Abs: 2214 cells/uL (ref 1500–7800)
Neutrophils Relative %: 49.2 %
Platelets: 283 10*3/uL (ref 140–400)
RBC: 4.71 10*6/uL (ref 3.80–5.10)
RDW: 11.8 % (ref 11.0–15.0)
Total Lymphocyte: 39.9 %
WBC: 4.5 10*3/uL (ref 3.8–10.8)

## 2019-11-11 LAB — HEPATIC FUNCTION PANEL
AG Ratio: 1.8 (calc) (ref 1.0–2.5)
ALT: 57 U/L — ABNORMAL HIGH (ref 6–29)
AST: 36 U/L — ABNORMAL HIGH (ref 10–35)
Albumin: 4.4 g/dL (ref 3.6–5.1)
Alkaline phosphatase (APISO): 95 U/L (ref 37–153)
Bilirubin, Direct: 0.1 mg/dL (ref 0.0–0.2)
Globulin: 2.4 g/dL (calc) (ref 1.9–3.7)
Indirect Bilirubin: 0.4 mg/dL (calc) (ref 0.2–1.2)
Total Bilirubin: 0.5 mg/dL (ref 0.2–1.2)
Total Protein: 6.8 g/dL (ref 6.1–8.1)

## 2019-11-11 LAB — T3, FREE: T3, Free: 3.3 pg/mL (ref 2.3–4.2)

## 2019-11-11 LAB — TSH: TSH: 2.72 mIU/L (ref 0.40–4.50)

## 2019-11-11 LAB — T4, FREE: Free T4: 1.1 ng/dL (ref 0.8–1.8)

## 2019-11-12 ENCOUNTER — Encounter: Payer: Self-pay | Admitting: Family Medicine

## 2019-11-12 NOTE — Telephone Encounter (Signed)
Patient scheduled.

## 2019-11-16 ENCOUNTER — Encounter: Payer: Self-pay | Admitting: Family Medicine

## 2019-11-16 ENCOUNTER — Telehealth (INDEPENDENT_AMBULATORY_CARE_PROVIDER_SITE_OTHER): Payer: BC Managed Care – PPO | Admitting: Family Medicine

## 2019-11-16 DIAGNOSIS — E785 Hyperlipidemia, unspecified: Secondary | ICD-10-CM | POA: Insufficient documentation

## 2019-11-16 DIAGNOSIS — R7401 Elevation of levels of liver transaminase levels: Secondary | ICD-10-CM | POA: Diagnosis not present

## 2019-11-16 NOTE — Progress Notes (Signed)
Subjective:    Patient ID: Brittany Bartlett, female    DOB: 03-29-1961, 59 y.o.   MRN: [id number]  HPI Virtual Visit via Video Note  I connected with the patient on 11/16/19 at 11:30 AM EDT by a video enabled telemedicine application and verified that I am speaking with the correct person using two identifiers.  Location patient: home Location provider:work or home office Persons participating in the virtual visit: patient, provider  I discussed the limitations of evaluation and management by telemedicine and the availability of in person appointments. The patient expressed understanding and agreed to proceed.   HPI: Here to discuss results from her well visit last week. These are notable for her LDL being high at 146, HDL being excellent at 72, and her transaminases being mildly elevated at 36 and 57. She feels fine but she admits that she weighs more now than at any time in her life, other than when she was pregnant.    ROS: See pertinent positives and negatives per HPI.  Past Medical History:  Diagnosis Date  . Anemia    history  . Headache(784.0)   . Migraine   . SVD (spontaneous vaginal delivery)    x 1    Past Surgical History:  Procedure Laterality Date  . BREAST SURGERY     marker in left breast - watching an area  . COLONOSCOPY  2011   per Dr. Sharlett Iles, clear. Cologuard negative June 2021    . DIAGNOSTIC LAPAROSCOPY     x 2  . DILATATION & CURRETTAGE/HYSTEROSCOPY WITH RESECTOCOPE N/A 06/25/2012   Procedure: DILATATION & CURETTAGE/HYSTEROSCOPY WITH RESECTOCOPE;  Surgeon: Farrel Gobble. Harrington Challenger, MD;  Location: [facility];  Service: Gynecology;  Laterality: N/A;- NO D AND C PER PT- WITH THIS SURGERY   . SHOULDER SURGERY     to remove bone spur 11-2007 Dr Tonita Cong  . TONSILLECTOMY    . WISDOM TOOTH EXTRACTION      Family History  Problem Relation Age of Onset  . Migraines Other        family hx  . Hyperlipidemia Other        family hx  . Hypertension Other        family hx    . Prostate cancer Other        family hx 1st degree <50  . Heart disease Father   . Leukemia Paternal Uncle   . Stomach cancer Paternal Grandmother   . Colon polyps Neg Hx   . Colon cancer Neg Hx   . Esophageal cancer Neg Hx   . Rectal cancer Neg Hx      Current Outpatient Medications:  .  OVER THE COUNTER MEDICATION, Take 2 capsules by mouth daily. Cellular Vitality Complex, Disp: , Rfl:  .  OVER THE COUNTER MEDICATION, Take 2 capsules by mouth daily. Food Nutrient Complex, Disp: , Rfl:  .  OVER THE COUNTER MEDICATION, Take 2 capsules by mouth daily. Essential Oil Omega Complex, Disp: , Rfl:  .  OVER THE COUNTER MEDICATION, BONE NUTRIENT, Disp: , Rfl:  .  OVER THE COUNTER MEDICATION, WOMENS PHYTOESTROGEN, Disp: , Rfl:  .  OVER THE COUNTER MEDICATION, ZENDOCRINE COMPLEX - ORGAN DETOX, Disp: , Rfl:  .  OVER THE COUNTER MEDICATION, ONGURAD SOFT GELS- IMMUNE SUPPORT, Disp: , Rfl:  .  OVER THE COUNTER MEDICATION, zINC AND HOLY bASIL BID, Disp: , Rfl:  .  Probiotic Product (PROBIOTIC DAILY PO), Take 1-2 capsules by mouth daily. Takes once or twice daily,  Disp: , Rfl:  .  tobramycin-dexamethasone (TOBRADEX) ophthalmic solution, Place 2 drops into the left eye every 4 (four) hours while awake., Disp: 5 mL, Rfl: 0 .  VITAMIN D-VITAMIN K PO, Take by mouth., Disp: , Rfl:   EXAM:  VITALS per patient if applicable:  GENERAL: alert, oriented, appears well and in no acute distress  HEENT: atraumatic, conjunttiva clear, no obvious abnormalities on inspection of external nose and ears  NECK: normal movements of the head and neck  LUNGS: on inspection no signs of respiratory distress, breathing rate appears normal, no obvious gross SOB, gasping or wheezing  CV: no obvious cyanosis  MS: moves all visible extremities without noticeable abnormality  PSYCH/NEURO: pleasant and cooperative, no obvious depression or anxiety, speech and thought processing grossly intact  ASSESSMENT AND PLAN: She  has some dyslipidemia and some elevated transaminases, likely due to a mild fatty liver. She will reduce her intake of fatty foods and she will aim to lose 20 lbs in the next 6 months. We will repeat labs in 6 months to follow all these.  Alysia Penna, MD  Discussed the following assessment and plan:  No diagnosis found.     I discussed the assessment and treatment plan with the patient. The patient was provided an opportunity to ask questions and all were answered. The patient agreed with the plan and demonstrated an understanding of the instructions.   The patient was advised to call back or seek an in-person evaluation if the symptoms worsen or if the condition fails to improve as anticipated.     Review of Systems     Objective:   Physical Exam        Assessment & Plan:

## 2019-11-20 ENCOUNTER — Other Ambulatory Visit: Payer: Self-pay

## 2019-11-20 ENCOUNTER — Encounter (HOSPITAL_BASED_OUTPATIENT_CLINIC_OR_DEPARTMENT_OTHER): Payer: Self-pay | Admitting: *Deleted

## 2019-11-20 DIAGNOSIS — N9089 Other specified noninflammatory disorders of vulva and perineum: Secondary | ICD-10-CM | POA: Insufficient documentation

## 2019-11-20 DIAGNOSIS — N764 Abscess of vulva: Secondary | ICD-10-CM | POA: Diagnosis present

## 2019-11-20 NOTE — ED Triage Notes (Signed)
Pt reports to right labia with increasing groin pain since yesterday

## 2019-11-21 ENCOUNTER — Emergency Department (HOSPITAL_BASED_OUTPATIENT_CLINIC_OR_DEPARTMENT_OTHER)
Admission: EM | Admit: 2019-11-21 | Discharge: 2019-11-21 | Disposition: A | Discharge status: Home / Self Care | Payer: BC Managed Care – PPO | Attending: Emergency Medicine | Admitting: Emergency Medicine

## 2019-11-21 ENCOUNTER — Encounter (HOSPITAL_BASED_OUTPATIENT_CLINIC_OR_DEPARTMENT_OTHER): Payer: Self-pay | Admitting: Emergency Medicine

## 2019-11-21 DIAGNOSIS — N9489 Other specified conditions associated with female genital organs and menstrual cycle: Secondary | ICD-10-CM

## 2019-11-21 MED ORDER — CLINDAMYCIN HCL 300 MG PO CAPS
300.0000 mg | ORAL_CAPSULE | Freq: Three times a day (TID) | ORAL | 0 refills | Status: DC
Start: 2019-11-21 — End: 2021-01-12

## 2019-11-21 MED ORDER — ALIGN 4 MG PO CAPS: 1.0000 | ORAL_CAPSULE | Freq: Three times a day (TID) | ORAL | 0 refills | Status: DC

## 2019-11-21 MED ORDER — NAPROXEN 250 MG PO TABS
500.0000 mg | ORAL_TABLET | Freq: Once | ORAL | Status: AC
  Administered 2019-11-21: 500 mg via ORAL
  Filled 2019-11-21: qty 2

## 2019-11-21 MED ORDER — CLINDAMYCIN HCL 150 MG PO CAPS
300.0000 mg | ORAL_CAPSULE | ORAL | Status: AC
  Administered 2019-11-21: 300 mg via ORAL
  Filled 2019-11-21: qty 2

## 2019-11-21 MED ORDER — ACETAMINOPHEN 500 MG PO TABS
1000.0000 mg | ORAL_TABLET | Freq: Once | ORAL | Status: DC
  Filled 2019-11-21: qty 2

## 2019-11-21 MED ORDER — NAPROXEN 375 MG PO TABS
375.0000 mg | ORAL_TABLET | Freq: Two times a day (BID) | ORAL | 0 refills | Status: DC
Start: 2019-11-21 — End: 2021-01-12

## 2019-11-21 NOTE — ED Provider Notes (Signed)
Brittany Bartlett   CSN: [id number] Arrival date & time: 11/20/19  2304     History Chief Complaint  Patient presents with  . Abscess    Brittany Bartlett is a 59 y.o. female.  The history is provided by the patient.  Groin Pain This is a new problem. The current episode started more than 2 days ago. The problem has not changed since onset.Pertinent negatives include no chest pain, no abdominal pain, no headaches and no shortness of breath. Nothing aggravates the symptoms. Nothing relieves the symptoms. Treatments tried: tea tree oil  The treatment provided no relief.  Patient has right groin pain. She states she has an abscess of the right labia.       Past Medical History:  Diagnosis Date  . Anemia    history  . Headache(784.0)   . Migraine   . SVD (spontaneous vaginal delivery)    x 1    Patient Active Problem List   Diagnosis Date Noted  . Dyslipidemia 11/16/2019  . Right ankle injury, subsequent encounter 08/01/2016  . Hydradenitis 06/06/2014  . ANEMIA-NOS 01/09/2007  . BENIGN POSITIONAL VERTIGO 01/09/2007  . HEADACHE 12/03/2006    Past Surgical History:  Procedure Laterality Date  . BREAST SURGERY     marker in left breast - watching an area  . COLONOSCOPY  2011   per Dr. Sharlett Iles, clear. Cologuard negative June 2021    . DIAGNOSTIC LAPAROSCOPY     x 2  . DILATATION & CURRETTAGE/HYSTEROSCOPY WITH RESECTOCOPE N/A 06/25/2012   Procedure: DILATATION & CURETTAGE/HYSTEROSCOPY WITH RESECTOCOPE;  Surgeon: Farrel Gobble. Harrington Challenger, MD;  Location: [facility];  Service: Gynecology;  Laterality: N/A;- NO D AND C PER PT- WITH THIS SURGERY   . SHOULDER SURGERY     to remove bone spur 11-2007 Dr Tonita Cong  . TONSILLECTOMY    . WISDOM TOOTH EXTRACTION       OB History   No obstetric history on file.     Family History  Problem Relation Age of Onset  . Migraines Other        family hx  . Hyperlipidemia Other        family hx  . Hypertension  Other        family hx  . Prostate cancer Other        family hx 1st degree <50  . Heart disease Father   . Leukemia Paternal Uncle   . Stomach cancer Paternal Grandmother   . Colon polyps Neg Hx   . Colon cancer Neg Hx   . Esophageal cancer Neg Hx   . Rectal cancer Neg Hx     Social History   Tobacco Use  . Smoking status: Never Smoker  . Smokeless tobacco: Never Used  Substance Use Topics  . Alcohol use: Yes    Alcohol/week: 0.0 standard drinks    Comment: occ  . Drug use: No    Home Medications Prior to Admission medications   Medication Sig Start Date End Date Taking? Authorizing Provider  OVER THE COUNTER MEDICATION Take 2 capsules by mouth daily. Cellular Vitality Complex    [provider]  OVER THE COUNTER MEDICATION Take 2 capsules by mouth daily. Food Nutrient Complex    [provider]  OVER THE COUNTER MEDICATION Take 2 capsules by mouth daily. Essential Oil Omega Complex    [provider]  OVER THE COUNTER MEDICATION BONE NUTRIENT    [provider]  OVER THE  COUNTER MEDICATION WOMENS PHYTOESTROGEN    [provider]  OVER THE COUNTER MEDICATION Brittany Bartlett    [provider]  OVER THE COUNTER MEDICATION ONGURAD SOFT GELS- IMMUNE SUPPORT    [provider]  OVER THE COUNTER MEDICATION zINC AND HOLY bASIL BID    [provider]  Probiotic Product (PROBIOTIC DAILY PO) Take 1-2 capsules by mouth daily. Takes once or twice daily    [provider]  tobramycin-dexamethasone [facility]) ophthalmic solution Place 2 drops into the left eye every 4 (four) hours while awake. 11/10/19   Laurey Morale, MD  VITAMIN D-VITAMIN K PO Take by mouth.    [provider]    Allergies    Sulfonamide derivatives  Review of Systems   Review of Systems  Constitutional: Negative for fever.  HENT: Negative for congestion.   Eyes: Negative for visual disturbance.    Respiratory: Negative for shortness of breath.   Cardiovascular: Negative for chest pain.  Gastrointestinal: Negative for abdominal pain.  Genitourinary: Negative for dysuria.  Musculoskeletal: Negative for arthralgias.  Skin: Negative for rash.  Neurological: Negative for headaches.  Psychiatric/Behavioral: Negative for agitation.  All other systems reviewed and are negative.   Physical Exam Updated Vital Signs BP 113/66 (BP Location: Left Arm)   Pulse 81   Temp 98.3 F (36.8 C) (Oral)   Resp 16   Ht 5\' 6"  (1.676 m)   Wt 65.3 kg   LMP 02/11/2011   SpO2 98%   BMI 23.24 kg/m   Physical Exam Vitals and nursing Bartlett reviewed. Exam conducted with a chaperone present.  Constitutional:      General: She is not in acute distress.    Appearance: Normal appearance.  HENT:     Head: Normocephalic and atraumatic.     Nose: Nose normal.  Eyes:     Conjunctiva/sclera: Conjunctivae normal.     Pupils: Pupils are equal, round, and reactive to light.  Cardiovascular:     Rate and Rhythm: Normal rate and regular rhythm.     Pulses: Normal pulses.     Heart sounds: Normal heart sounds.  Pulmonary:     Effort: Pulmonary effort is normal.     Breath sounds: Normal breath sounds.  Abdominal:     General: Abdomen is flat. Bowel sounds are normal.     Palpations: Abdomen is soft.     Tenderness: There is no abdominal tenderness. There is no guarding or rebound.  Genitourinary:    Comments: Lobulated lesion of the right labia, it is hard to the touch and not fluctuant. No warmth.   skin overlying is irritated.  Chaperone Val was present for exam and bedside US.   Musculoskeletal:        General: Normal range of motion.     Cervical back: Normal range of motion and neck supple.  Skin:    General: Skin is warm and dry.     Capillary Refill: Capillary refill takes less than 2 seconds.  Neurological:     General: No focal deficit present.     Mental Status: She is alert and oriented to  person, place, and time.  Psychiatric:        Mood and Affect: Mood normal.        Behavior: Behavior normal.     ED Results / Procedures / Treatments   Labs (all labs ordered are listed, but only abnormal results are displayed) Labs Reviewed  PREGNANCY, URINE  URINALYSIS,  ROUTINE W REFLEX MICROSCOPIC    EKG None  Radiology No results found.  Procedures Procedures (including critical care time)  Medications Ordered in ED Medications  clindamycin (CLEOCIN) capsule 300 mg (has no administration in time range)  acetaminophen (TYLENOL) tablet 1,000 mg (has no administration in time range)  naproxen (NAPROSYN) tablet 500 mg (has no administration in time range)    ED Course  I have reviewed the triage vital signs and the nursing notes.  Pertinent labs & imaging results that were available during my care of the patient were reviewed by me and considered in my medical decision making (see chart for details).   bedside US reveals a lobulated, solid lesion inside the right labia with internal calcifications. It is not consistent with abscess or liquid on [place].  Patient is upset we cannot make an official diagnosis in the ED.  EDP apologized to the patient ans stated she was safe to follow up with OB GYN.    I explained we would start antibiotics for the skin irritation and to prevent abscess and anti inflammatory pain medication for pain.    120 am: case d/w Dr. Landry Mellow, I explained the lesion is lobulated and solid on [place] but I do not have transvaginal capability.  No lymph nodes.  Dr. Landry Mellow also recommends antibiotics and close follow up in the office this coming week.  Patient to call Monday to be seen.    I have explained to the patient that Dr. Landry Mellow agrees with this plan of care and to call the office Monday to be seen. Patient is now happier.   Brittany Bartlett was evaluated in Emergency Department on 11/21/2019 for the symptoms described in the history of present illness. She was  evaluated in the context of the global COVID-19 pandemic, which necessitated consideration that the patient might be at risk for infection with the SARS-CoV-2 virus that causes COVID-19. Institutional protocols and algorithms that pertain to the evaluation of patients at risk for COVID-19 are in a state of rapid change based on information released by regulatory bodies including the CDC and federal and state organizations. These policies and algorithms were followed during the patient's care in the ED.  Final Clinical Impression(s) / ED Diagnoses Return for intractable cough, coughing up blood,fevers >100.4 unrelieved by medication, shortness of breath, intractable vomiting, chest pain, shortness of breath, weakness,numbness, changes in speech, facial asymmetry,abdominal pain, passing out,Inability to tolerate liquids or food, cough, altered mental status or any concerns. No signs of systemic illness or infection. The patient is nontoxic-appearing on exam and vital signs are within normal limits.   I have reviewed the triage vital signs and the nursing notes. Pertinent labs &imaging results that were available during my care of the patient were reviewed by me and considered in my medical decision making (see chart for details).After history, exam, and medical workup I feel the patient has beenappropriately medically screened and is safe for discharge home. Pertinent diagnoses were discussed with the patient. Patient was given return precautions.   Linden Mikes, MD 11/21/19 0140

## 2019-11-21 NOTE — ED Notes (Signed)
Contacted Dr. Landry Mellow @ [facility] @ [phone]

## 2020-03-15 ENCOUNTER — Other Ambulatory Visit: Payer: Self-pay | Admitting: Obstetrics and Gynecology

## 2020-03-15 DIAGNOSIS — Z1231 Encounter for screening mammogram for malignant neoplasm of breast: Secondary | ICD-10-CM

## 2020-05-02 ENCOUNTER — Ambulatory Visit: Payer: BC Managed Care – PPO

## 2020-05-22 ENCOUNTER — Ambulatory Visit (INDEPENDENT_AMBULATORY_CARE_PROVIDER_SITE_OTHER): Payer: BC Managed Care – PPO | Admitting: Family Medicine

## 2020-05-22 ENCOUNTER — Other Ambulatory Visit: Payer: Self-pay

## 2020-05-22 ENCOUNTER — Encounter: Payer: Self-pay | Admitting: Family Medicine

## 2020-05-22 VITALS — BP 122/80 | HR 70 | Temp 97.9°F | Ht 66.0 in | Wt 137.4 lb

## 2020-05-22 DIAGNOSIS — E785 Hyperlipidemia, unspecified: Secondary | ICD-10-CM

## 2020-05-22 LAB — LIPID PANEL
Cholesterol: 239 mg/dL — ABNORMAL HIGH (ref 0–200)
HDL: 68.3 mg/dL (ref >39.00)
LDL Cholesterol: 159 mg/dL — ABNORMAL HIGH (ref 0–99)
NonHDL: 170.61
Total CHOL/HDL Ratio: 3
Triglycerides: 58 mg/dL (ref 0.0–149.0)
VLDL: 11.6 mg/dL (ref 0.0–40.0)

## 2020-05-22 LAB — HEPATIC FUNCTION PANEL
ALT: 25 U/L (ref 0–35)
AST: 20 U/L (ref 0–37)
Albumin: 4 g/dL (ref 3.5–5.2)
Alkaline Phosphatase: 75 U/L (ref 39–117)
Bilirubin, Direct: 0.1 mg/dL (ref 0.0–0.3)
Total Bilirubin: 0.3 mg/dL (ref 0.2–1.2)
Total Protein: 6.6 g/dL (ref 6.0–8.3)

## 2020-05-22 NOTE — Progress Notes (Signed)
   Subjective:    Patient ID: Brittany Bartlett, female    DOB: [birth year], 60 y.o.   MRN: [id number]  HPI Here to follow up on elevated lipids and transaminases. She feels fine. She has worked on her diet and has lost 17 lbs since her last visit here.    Review of Systems  Constitutional: Negative.   Respiratory: Negative.   Cardiovascular: Negative.        Objective:   Physical Exam Constitutional:      Appearance: Normal appearance.  Cardiovascular:     Rate and Rhythm: Normal rate and regular rhythm.     Pulses: Normal pulses.     Heart sounds: Normal heart sounds.  Pulmonary:     Effort: Pulmonary effort is normal.     Breath sounds: Normal breath sounds.  Neurological:     Mental Status: She is alert.           Assessment & Plan:  Dyslipidemia. We will check fasting labs today.  Alysia Penna, MD

## 2020-07-24 ENCOUNTER — Other Ambulatory Visit: Payer: Self-pay

## 2020-07-24 ENCOUNTER — Ambulatory Visit
Admission: RE | Admit: 2020-07-24 | Discharge: 2020-07-24 | Disposition: A | Discharge status: Home / Self Care | Payer: BC Managed Care – PPO | Source: Ambulatory Visit | Attending: Obstetrics and Gynecology | Admitting: Obstetrics and Gynecology

## 2020-07-24 DIAGNOSIS — Z1231 Encounter for screening mammogram for malignant neoplasm of breast: Secondary | ICD-10-CM

## 2021-01-05 ENCOUNTER — Other Ambulatory Visit: Payer: Self-pay

## 2021-01-05 ENCOUNTER — Emergency Department (HOSPITAL_BASED_OUTPATIENT_CLINIC_OR_DEPARTMENT_OTHER)
Admission: EM | Admit: 2021-01-05 | Discharge: 2021-01-05 | Disposition: A | Discharge status: Home / Self Care | Payer: BC Managed Care – PPO | Attending: Emergency Medicine | Admitting: Emergency Medicine

## 2021-01-05 ENCOUNTER — Encounter (HOSPITAL_BASED_OUTPATIENT_CLINIC_OR_DEPARTMENT_OTHER): Payer: Self-pay | Admitting: *Deleted

## 2021-01-05 DIAGNOSIS — S61012A Laceration without foreign body of left thumb without damage to nail, initial encounter: Secondary | ICD-10-CM | POA: Diagnosis not present

## 2021-01-05 DIAGNOSIS — S6992XA Unspecified injury of left wrist, hand and finger(s), initial encounter: Secondary | ICD-10-CM | POA: Diagnosis present

## 2021-01-05 DIAGNOSIS — W268XXA Contact with other sharp object(s), not elsewhere classified, initial encounter: Secondary | ICD-10-CM | POA: Diagnosis not present

## 2021-01-05 MED ORDER — LIDOCAINE HCL 2 % IJ SOLN
5.0000 mL | Freq: Once | INTRAMUSCULAR | Status: AC
  Administered 2021-01-05: 100 mg
  Filled 2021-01-05: qty 20

## 2021-01-05 NOTE — Discharge Instructions (Addendum)
Have the sutures taken out in about a week.

## 2021-01-05 NOTE — ED Provider Notes (Signed)
[facility] Provider Note   CSN: [id number] Arrival date & time: 01/05/21  1840     History Chief Complaint  Patient presents with   Extremity Laceration    Brittany Bartlett is a 60 y.o. female.  HPI Patient presents after laceration to her left thumb.  States she was holding a mug and it broke and the handle caught her on the palmar aspect of her left thumb.  Had been some bleeding.  Tetanus is up-to-date.  Thinks she may need stitches.  Still has feeling on her thumb and is able to move it freely.    Past Medical History:  Diagnosis Date   Anemia    history   Headache(784.0)    Migraine    SVD (spontaneous vaginal delivery)    x 1    Patient Active Problem List   Diagnosis Date Noted   Dyslipidemia 11/16/2019   Right ankle injury, subsequent encounter 08/01/2016   Hydradenitis 06/06/2014   ANEMIA-NOS 01/09/2007   BENIGN POSITIONAL VERTIGO 01/09/2007   HEADACHE 12/03/2006    Past Surgical History:  Procedure Laterality Date   BREAST SURGERY     marker in left breast - watching an area   COLONOSCOPY  2011   per Dr. Sharlett Iles, clear. Cologuard negative June 2021     DIAGNOSTIC LAPAROSCOPY     x 2   DILATATION & CURRETTAGE/HYSTEROSCOPY WITH RESECTOCOPE N/A 06/25/2012   Procedure: DILATATION & CURETTAGE/HYSTEROSCOPY WITH RESECTOCOPE;  Surgeon: Farrel Gobble. Harrington Challenger, MD;  Location: [facility];  Service: Gynecology;  Laterality: N/A;- NO D AND C PER PT- WITH THIS SURGERY    SHOULDER SURGERY     to remove bone spur 11-2007 Dr Tonita Cong   TONSILLECTOMY     WISDOM TOOTH EXTRACTION       OB History   No obstetric history on file.     Family History  Problem Relation Age of Onset   Migraines Other        family hx   Hyperlipidemia Other        family hx   Hypertension Other        family hx   Prostate cancer Other        family hx 1st degree <50   Heart disease Father    Leukemia Paternal Uncle    Stomach cancer Paternal Grandmother    Colon  polyps Neg Hx    Colon cancer Neg Hx    Esophageal cancer Neg Hx    Rectal cancer Neg Hx     Social History   Tobacco Use   Smoking status: Never   Smokeless tobacco: Never  Substance Use Topics   Alcohol use: Yes    Alcohol/week: 0.0 standard drinks    Comment: occ   Drug use: No    Home Medications Prior to Admission medications   Medication Sig Start Date End Date Taking? Authorizing Provider  clindamycin (CLEOCIN) 300 MG capsule Take 1 capsule (300 mg total) by mouth 3 (three) times daily. 11/21/19   Palumbo, April, MD  naproxen (NAPROSYN) 375 MG tablet Take 1 tablet (375 mg total) by mouth 2 (two) times daily. 11/21/19   Palumbo, April, MD  OVER THE COUNTER MEDICATION Take 2 capsules by mouth daily. Cellular Vitality Complex    [provider]  OVER THE COUNTER MEDICATION Take 2 capsules by mouth daily. Food Nutrient Complex    [provider]  OVER THE COUNTER MEDICATION Take 2 capsules by mouth daily. Essential Oil  Omega Complex    [provider]  OVER THE COUNTER MEDICATION BONE NUTRIENT    [provider]  OVER THE COUNTER MEDICATION WOMENS PHYTOESTROGEN    [provider]  OVER THE COUNTER MEDICATION Gem - ORGAN DETOX    [provider]  OVER THE COUNTER MEDICATION ONGURAD SOFT GELS- IMMUNE SUPPORT    [provider]  OVER THE COUNTER MEDICATION zINC AND HOLY bASIL BID    [provider]  Probiotic Product (ALIGN) 4 MG CAPS Take 1 capsule (4 mg total) by mouth in the morning, at noon, and at bedtime. 11/21/19   Palumbo, April, MD  Probiotic Product (PROBIOTIC DAILY PO) Take 1-2 capsules by mouth daily. Takes once or twice daily    [provider]  tobramycin-dexamethasone [facility]) ophthalmic solution Place 2 drops into the left eye every 4 (four) hours while awake. 11/10/19   Laurey Morale, MD  VITAMIN D-VITAMIN K PO Take by mouth.    [provider]    Allergies     Sulfonamide derivatives  Review of Systems   Review of Systems  Constitutional:  Negative for appetite change and fever.  Skin:  Positive for wound.  Neurological:  Negative for weakness, light-headedness and numbness.  Psychiatric/Behavioral:  Negative for confusion.    Physical Exam Updated Vital Signs BP 140/72 (BP Location: Right Arm)   Pulse 74   Temp 98.5 F (36.9 C) (Oral)   Resp 17   Ht 5\' 6"  (1.676 m)   Wt 60.8 kg   LMP 02/11/2011   SpO2 99%   BMI 21.63 kg/m   Physical Exam Vitals and nursing note reviewed.  Musculoskeletal:     Comments: Approximately 1.5 cm laceration along the palmar aspect of the proximal phalanx of the left thumb.  Is horizontal across the thumb.  No tendon damage seen.  Good flexion extension of the thumb.  Sensation intact distally.  Skin:    General: Skin is warm.     Capillary Refill: Capillary refill takes less than 2 seconds.  Neurological:     Mental Status: She is alert and oriented to person, place, and time.    ED Results / Procedures / Treatments   Labs (all labs ordered are listed, but only abnormal results are displayed) Labs Reviewed - No data to display  EKG None  Radiology No results found.  Procedures .Marland KitchenLaceration Repair  Date/Time: 01/05/2021 9:07 PM Performed by: Davonna Belling, MD Authorized by: Davonna Belling, MD   Consent:    Consent obtained:  Verbal   Consent given by:  Patient   Risks, benefits, and alternatives were discussed: yes     Risks discussed:  Infection, pain, retained foreign body, need for additional repair, poor cosmetic result, tendon damage, vascular damage, poor wound healing and nerve damage   Alternatives discussed:  No treatment, observation and delayed treatment Anesthesia:    Anesthesia method:  Local infiltration   Local anesthetic:  Lidocaine 2% w/o epi Laceration details:    Location:  Finger   Finger location:  L thumb   Length (cm):  1.5 Pre-procedure details:     Preparation:  Patient was prepped and draped in usual sterile fashion Exploration:    Limited defect created (wound extended): no     Hemostasis achieved with:  Direct pressure   Wound exploration: wound explored through full range of motion and entire depth of wound visualized     Wound extent: no tendon damage noted   Treatment:  Wound cleansed with: alcohol.   Debridement:  None   Undermining:  None   Scar revision: no   Skin repair:    Repair method:  Sutures   Suture size:  4-0   Suture material:  Prolene   Suture technique:  Simple interrupted   Number of sutures:  4 Approximation:    Approximation:  Close Repair type:    Repair type:  Simple Post-procedure details:    Dressing:  Sterile dressing   Procedure completion:  Tolerated well, no immediate complications   Medications Ordered in ED Medications  lidocaine (XYLOCAINE) 2 % (with pres) injection 100 mg (100 mg Infiltration Given by Other 01/05/21 2028)    ED Course  I have reviewed the triage vital signs and the nursing notes.  Pertinent labs & imaging results that were available during my care of the patient were reviewed by me and considered in my medical decision making (see chart for details).    MDM Rules/Calculators/A&P                           Patient with thumb laceration.  Closed by myself.  Patient refused x-ray but no foreign body seen.  Discharge home.  Tetanus is up-to-date. Final Clinical Impression(s) / ED Diagnoses Final diagnoses:  Laceration of left thumb without foreign body without damage to nail, initial encounter    Rx / DC Orders ED Discharge Orders     None        Davonna Belling, MD 01/05/21 2108

## 2021-01-05 NOTE — ED Triage Notes (Signed)
C/o lac to left thumb x 1 hr ago by glass

## 2021-01-12 ENCOUNTER — Ambulatory Visit (INDEPENDENT_AMBULATORY_CARE_PROVIDER_SITE_OTHER): Payer: BC Managed Care – PPO | Admitting: Family Medicine

## 2021-01-12 ENCOUNTER — Other Ambulatory Visit: Payer: Self-pay

## 2021-01-12 ENCOUNTER — Encounter: Payer: Self-pay | Admitting: Family Medicine

## 2021-01-12 VITALS — BP 106/64 | HR 63 | Temp 98.6°F | Wt 132.2 lb

## 2021-01-12 DIAGNOSIS — M810 Age-related osteoporosis without current pathological fracture: Secondary | ICD-10-CM

## 2021-01-12 DIAGNOSIS — S61012D Laceration without foreign body of left thumb without damage to nail, subsequent encounter: Secondary | ICD-10-CM | POA: Diagnosis not present

## 2021-01-12 NOTE — Progress Notes (Signed)
   Subjective:    Patient ID: Brittany Bartlett, female    DOB: 1961-01-09, 60 y.o.   MRN: [id number]  HPI Here to follow up an ER visit on 01-05-21 for an injury to the left thumb that occurred that day at home. While she was drying dishes, a coffee mug broke in her hands and she got a laceration to the thumb. At the ER this was closed with 4 sutures. Since then it has felt sore.    Review of Systems  Constitutional: Negative.   Respiratory: Negative.    Cardiovascular: Negative.   Skin:  Positive for wound.      Objective:   Physical Exam Constitutional:      General: She is not in acute distress.    Appearance: Normal appearance.  Cardiovascular:     Rate and Rhythm: Normal rate and regular rhythm.     Pulses: Normal pulses.     Heart sounds: Normal heart sounds.  Pulmonary:     Effort: Pulmonary effort is normal.     Breath sounds: Normal breath sounds.  Skin:    Comments: There is a 1 cm laceration to the flexor surface of the left thumb. The area is clean. She has full ROM   Neurological:     Mental Status: She is alert.          Assessment & Plan:  Thumb laceration. All sutures were removed. She will continue to dress this with petroleum ointment and gauze for another week.  Alysia Penna, MD

## 2021-01-22 ENCOUNTER — Ambulatory Visit (INDEPENDENT_AMBULATORY_CARE_PROVIDER_SITE_OTHER)
Admission: RE | Admit: 2021-01-22 | Discharge: 2021-01-22 | Disposition: A | Discharge status: Home / Self Care | Payer: BC Managed Care – PPO | Source: Ambulatory Visit | Attending: Family Medicine | Admitting: Family Medicine

## 2021-01-22 ENCOUNTER — Other Ambulatory Visit: Payer: Self-pay

## 2021-01-22 DIAGNOSIS — M810 Age-related osteoporosis without current pathological fracture: Secondary | ICD-10-CM

## 2021-01-23 DIAGNOSIS — M810 Age-related osteoporosis without current pathological fracture: Secondary | ICD-10-CM | POA: Diagnosis not present

## 2021-05-10 ENCOUNTER — Encounter: Payer: Self-pay | Admitting: Family Medicine

## 2021-05-10 DIAGNOSIS — R7303 Prediabetes: Secondary | ICD-10-CM

## 2021-05-11 DIAGNOSIS — R7303 Prediabetes: Secondary | ICD-10-CM | POA: Insufficient documentation

## 2021-05-11 NOTE — Telephone Encounter (Signed)
Rx was faxed to pt pharmacy, pt is aware

## 2021-05-11 NOTE — Telephone Encounter (Signed)
RX for glucometer is ready

## 2021-05-14 ENCOUNTER — Telehealth: Payer: Self-pay

## 2021-05-14 NOTE — Telephone Encounter (Signed)
Pt called the office this morning, state that her pharmacy did not receive orders for her/ husband for Diabetes supplies, orders faxed to pt pharmacy again this morning.

## 2021-05-25 ENCOUNTER — Encounter: Payer: Self-pay | Admitting: Family Medicine

## 2021-05-25 ENCOUNTER — Ambulatory Visit (INDEPENDENT_AMBULATORY_CARE_PROVIDER_SITE_OTHER): Payer: BC Managed Care – PPO | Admitting: Family Medicine

## 2021-05-25 VITALS — BP 98/64 | HR 63 | Temp 98.4°F | Ht 66.25 in | Wt 134.0 lb

## 2021-05-25 DIAGNOSIS — Z0000 Encounter for general adult medical examination without abnormal findings: Secondary | ICD-10-CM

## 2021-05-25 LAB — CBC WITH DIFFERENTIAL/PLATELET
Basophils Absolute: 0 10*3/uL (ref 0.0–0.1)
Basophils Relative: 0.7 % (ref 0.0–3.0)
Eosinophils Absolute: 0.1 10*3/uL (ref 0.0–0.7)
Eosinophils Relative: 2 % (ref 0.0–5.0)
HCT: 41.7 % (ref 36.0–46.0)
Hemoglobin: 13.9 g/dL (ref 12.0–15.0)
Lymphocytes Relative: 50 % — ABNORMAL HIGH (ref 12.0–46.0)
Lymphs Abs: 2 10*3/uL (ref 0.7–4.0)
MCHC: 33.2 g/dL (ref 30.0–36.0)
MCV: 92.2 fl (ref 78.0–100.0)
Monocytes Absolute: 0.3 10*3/uL (ref 0.1–1.0)
Monocytes Relative: 8 % (ref 3.0–12.0)
Neutro Abs: 1.6 10*3/uL (ref 1.4–7.7)
Neutrophils Relative %: 39.3 % — ABNORMAL LOW (ref 43.0–77.0)
Platelets: 285 10*3/uL (ref 150.0–400.0)
RBC: 4.53 Mil/uL (ref 3.87–5.11)
RDW: 12.8 % (ref 11.5–15.5)
WBC: 4.1 10*3/uL (ref 4.0–10.5)

## 2021-05-25 LAB — HEPATIC FUNCTION PANEL
ALT: 30 U/L (ref 0–35)
AST: 23 U/L (ref 0–37)
Albumin: 4.1 g/dL (ref 3.5–5.2)
Alkaline Phosphatase: 72 U/L (ref 39–117)
Bilirubin, Direct: 0.1 mg/dL (ref 0.0–0.3)
Total Bilirubin: 0.4 mg/dL (ref 0.2–1.2)
Total Protein: 7 g/dL (ref 6.0–8.3)

## 2021-05-25 LAB — LIPID PANEL
Cholesterol: 220 mg/dL — ABNORMAL HIGH (ref 0–200)
HDL: 78.2 mg/dL (ref >39.00)
LDL Cholesterol: 132 mg/dL — ABNORMAL HIGH (ref 0–99)
NonHDL: 141.41
Total CHOL/HDL Ratio: 3
Triglycerides: 48 mg/dL (ref 0.0–149.0)
VLDL: 9.6 mg/dL (ref 0.0–40.0)

## 2021-05-25 LAB — BASIC METABOLIC PANEL
BUN: 13 mg/dL (ref 6–23)
CO2: 33 mEq/L — ABNORMAL HIGH (ref 19–32)
Calcium: 9.8 mg/dL (ref 8.4–10.5)
Chloride: 103 mEq/L (ref 96–112)
Creatinine, Ser: 0.8 mg/dL (ref 0.40–1.20)
GFR: 78.81 mL/min (ref >60.00)
Glucose, Bld: 92 mg/dL (ref 70–99)
Potassium: 4.5 mEq/L (ref 3.5–5.1)
Sodium: 141 mEq/L (ref 135–145)

## 2021-05-25 LAB — HEMOGLOBIN A1C: Hgb A1c MFr Bld: 5.7 % (ref 4.6–6.5)

## 2021-05-25 LAB — TSH: TSH: 1.61 u[IU]/mL (ref 0.35–5.50)

## 2021-05-25 NOTE — Progress Notes (Signed)
° °  Subjective:    Patient ID: Brittany Bartlett, female    DOB: 12/21/1960, 61 y.o.   MRN: [id number]  HPI Here for a well exam. She feels fine.    Review of Systems  Constitutional: Negative.   HENT: Negative.    Eyes: Negative.   Respiratory: Negative.    Cardiovascular: Negative.   Gastrointestinal: Negative.   Genitourinary:  Negative for decreased urine volume, difficulty urinating, dyspareunia, dysuria, enuresis, flank pain, frequency, hematuria, pelvic pain and urgency.  Musculoskeletal: Negative.   Skin: Negative.   Neurological: Negative.  Negative for headaches.  Psychiatric/Behavioral: Negative.        Objective:   Physical Exam Constitutional:      General: She is not in acute distress.    Appearance: Normal appearance. She is well-developed.  HENT:     Head: Normocephalic and atraumatic.     Right Ear: External ear normal.     Left Ear: External ear normal.     Nose: Nose normal.     Mouth/Throat:     Pharynx: No oropharyngeal exudate.  Eyes:     General: No scleral icterus.    Conjunctiva/sclera: Conjunctivae normal.     Pupils: Pupils are equal, round, and reactive to light.  Neck:     Thyroid: No thyromegaly.     Vascular: No JVD.  Cardiovascular:     Rate and Rhythm: Normal rate and regular rhythm.     Heart sounds: Normal heart sounds. No murmur heard.   No friction rub. No gallop.  Pulmonary:     Effort: Pulmonary effort is normal. No respiratory distress.     Breath sounds: Normal breath sounds. No wheezing or rales.  Chest:     Chest wall: No tenderness.  Abdominal:     General: Bowel sounds are normal. There is no distension.     Palpations: Abdomen is soft. There is no mass.     Tenderness: There is no abdominal tenderness. There is no guarding or rebound.  Musculoskeletal:        General: No tenderness. Normal range of motion.     Cervical back: Normal range of motion and neck supple.  Lymphadenopathy:     Cervical: No cervical adenopathy.   Skin:    General: Skin is warm and dry.     Findings: No erythema or rash.  Neurological:     Mental Status: She is alert and oriented to person, place, and time.     Cranial Nerves: No cranial nerve deficit.     Motor: No abnormal muscle tone.     Coordination: Coordination normal.     Deep Tendon Reflexes: Reflexes are normal and symmetric. Reflexes normal.  Psychiatric:        Behavior: Behavior normal.        Thought Content: Thought content normal.        Judgment: Judgment normal.          Assessment & Plan:  Well exam. We discussed diet and exercise. Get fasting labs. Alysia Penna, MD

## 2021-05-28 ENCOUNTER — Encounter: Payer: BC Managed Care – PPO | Admitting: Family Medicine

## 2021-06-11 ENCOUNTER — Other Ambulatory Visit: Payer: Self-pay | Admitting: Obstetrics and Gynecology

## 2021-06-11 DIAGNOSIS — Z1231 Encounter for screening mammogram for malignant neoplasm of breast: Secondary | ICD-10-CM

## 2021-07-05 ENCOUNTER — Encounter: Payer: Self-pay | Admitting: Family Medicine

## 2021-08-13 ENCOUNTER — Ambulatory Visit
Admission: RE | Admit: 2021-08-13 | Discharge: 2021-08-13 | Disposition: A | Discharge status: Home / Self Care | Payer: BC Managed Care – PPO | Source: Ambulatory Visit | Attending: Obstetrics and Gynecology | Admitting: Obstetrics and Gynecology

## 2021-08-13 DIAGNOSIS — Z1231 Encounter for screening mammogram for malignant neoplasm of breast: Secondary | ICD-10-CM

## 2022-07-01 ENCOUNTER — Other Ambulatory Visit: Payer: Self-pay | Admitting: Obstetrics and Gynecology

## 2022-07-01 DIAGNOSIS — Z1231 Encounter for screening mammogram for malignant neoplasm of breast: Secondary | ICD-10-CM

## 2022-09-10 ENCOUNTER — Ambulatory Visit
Admission: RE | Admit: 2022-09-10 | Discharge: 2022-09-10 | Disposition: A | Discharge status: Home / Self Care | Payer: BC Managed Care – PPO | Source: Ambulatory Visit | Attending: Obstetrics and Gynecology | Admitting: Obstetrics and Gynecology

## 2022-09-10 DIAGNOSIS — Z1231 Encounter for screening mammogram for malignant neoplasm of breast: Secondary | ICD-10-CM

## 2022-10-21 ENCOUNTER — Other Ambulatory Visit (HOSPITAL_COMMUNITY)
Admission: RE | Admit: 2022-10-21 | Payer: BC Managed Care – PPO | Source: Ambulatory Visit | Admitting: Obstetrics and Gynecology

## 2022-10-21 DIAGNOSIS — Z01419 Encounter for gynecological examination (general) (routine) without abnormal findings: Secondary | ICD-10-CM | POA: Diagnosis not present

## 2022-10-22 LAB — CYTOLOGY - PAP
Comment: NEGATIVE
Diagnosis: NEGATIVE
High risk HPV: NEGATIVE

## 2023-02-24 ENCOUNTER — Encounter: Payer: BC Managed Care – PPO | Admitting: Family Medicine

## 2023-02-25 ENCOUNTER — Ambulatory Visit (INDEPENDENT_AMBULATORY_CARE_PROVIDER_SITE_OTHER): Payer: BC Managed Care – PPO | Admitting: Family Medicine

## 2023-02-25 ENCOUNTER — Encounter: Payer: Self-pay | Admitting: Family Medicine

## 2023-02-25 VITALS — BP 108/60 | HR 71 | Temp 98.2°F | Ht 66.25 in | Wt 142.8 lb

## 2023-02-25 DIAGNOSIS — Z0000 Encounter for general adult medical examination without abnormal findings: Secondary | ICD-10-CM | POA: Diagnosis not present

## 2023-02-25 NOTE — Progress Notes (Signed)
Subjective:    Patient ID: Brittany Bartlett, female    DOB: 06-Nov-1960, 62 y.o.   MRN: [id number]  HPI Here for a well exam. She has been feeling fine until 2 weeks ago. At that time she began to notice feelings of dizziness where the room seems to be moving around her and where she feels she may lose her balance. This is always brought on when she moves her head quickly to the side or when she gets up from a sitting position. She also had a mild right sided headache for 2-3 minutes last week that went away. No headaches since. She gets regular mammograms.    Review of Systems  Constitutional: Negative.   HENT: Negative.    Eyes: Negative.   Respiratory: Negative.    Cardiovascular: Negative.   Gastrointestinal: Negative.   Genitourinary:  Negative for decreased urine volume, difficulty urinating, dyspareunia, dysuria, enuresis, flank pain, frequency, hematuria, pelvic pain and urgency.  Musculoskeletal: Negative.   Skin: Negative.   Neurological:  Positive for dizziness. Negative for headaches.  Psychiatric/Behavioral: Negative.         Objective:   Physical Exam Constitutional:      General: She is not in acute distress.    Appearance: Normal appearance. She is well-developed.  HENT:     Head: Normocephalic and atraumatic.     Right Ear: Tympanic membrane, ear canal and external ear normal.     Left Ear: Tympanic membrane, ear canal and external ear normal.     Nose: Nose normal.     Mouth/Throat:     Pharynx: No oropharyngeal exudate.  Eyes:     General: No scleral icterus.    Conjunctiva/sclera: Conjunctivae normal.     Pupils: Pupils are equal, round, and reactive to light.  Neck:     Thyroid: No thyromegaly.     Vascular: No JVD.  Cardiovascular:     Rate and Rhythm: Normal rate and regular rhythm.     Pulses: Normal pulses.     Heart sounds: Normal heart sounds. No murmur heard.    No friction rub. No gallop.  Pulmonary:     Effort: Pulmonary effort is normal. No  respiratory distress.     Breath sounds: Normal breath sounds. No wheezing or rales.  Chest:     Chest wall: No tenderness.  Abdominal:     General: Bowel sounds are normal. There is no distension.     Palpations: Abdomen is soft. There is no mass.     Tenderness: There is no abdominal tenderness. There is no guarding or rebound.  Musculoskeletal:        General: No tenderness. Normal range of motion.     Cervical back: Normal range of motion and neck supple.  Lymphadenopathy:     Cervical: No cervical adenopathy.  Skin:    General: Skin is warm and dry.     Findings: No erythema or rash.  Neurological:     General: No focal deficit present.     Mental Status: She is alert and oriented to person, place, and time.     Cranial Nerves: No cranial nerve deficit.     Motor: No abnormal muscle tone.     Coordination: Coordination normal.     Gait: Gait normal.     Deep Tendon Reflexes: Reflexes are normal and symmetric. Reflexes normal.  Psychiatric:        Mood and Affect: Mood normal.        Behavior: Behavior  normal.        Thought Content: Thought content normal.           Assessment & Plan:  Well exam. We discussed diet and exercise. Get fasting labs. She is having some transient vertigo. I offered to give her Meclizine, but she preferred to avoid medications  if possible. She will return if the dizziness gets worse. Set up a colonoscopy.  Gershon Crane, MD

## 2023-02-28 ENCOUNTER — Other Ambulatory Visit (INDEPENDENT_AMBULATORY_CARE_PROVIDER_SITE_OTHER): Payer: BC Managed Care – PPO

## 2023-02-28 DIAGNOSIS — Z0000 Encounter for general adult medical examination without abnormal findings: Secondary | ICD-10-CM | POA: Diagnosis not present

## 2023-02-28 LAB — URINALYSIS, ROUTINE W REFLEX MICROSCOPIC
Bilirubin Urine: NEGATIVE
Hgb urine dipstick: NEGATIVE
Ketones, ur: NEGATIVE
Nitrite: NEGATIVE
Specific Gravity, Urine: 1.015 (ref 1.000–1.030)
Total Protein, Urine: NEGATIVE
Urine Glucose: NEGATIVE
Urobilinogen, UA: 0.2 (ref 0.0–1.0)
pH: 6.5 (ref 5.0–8.0)

## 2023-02-28 LAB — BASIC METABOLIC PANEL
BUN: 14 mg/dL (ref 6–23)
CO2: 30 meq/L (ref 19–32)
Calcium: 9.6 mg/dL (ref 8.4–10.5)
Chloride: 102 meq/L (ref 96–112)
Creatinine, Ser: 0.83 mg/dL (ref 0.40–1.20)
GFR: 74.48 mL/min (ref >60.00)
Glucose, Bld: 99 mg/dL (ref 70–99)
Potassium: 4.2 meq/L (ref 3.5–5.1)
Sodium: 139 meq/L (ref 135–145)

## 2023-02-28 LAB — LIPID PANEL
Cholesterol: 274 mg/dL — ABNORMAL HIGH (ref 0–200)
HDL: 74.4 mg/dL (ref >39.00)
LDL Cholesterol: 188 mg/dL — ABNORMAL HIGH (ref 0–99)
NonHDL: 199.74
Total CHOL/HDL Ratio: 4
Triglycerides: 58 mg/dL (ref 0.0–149.0)
VLDL: 11.6 mg/dL (ref 0.0–40.0)

## 2023-02-28 LAB — HEPATIC FUNCTION PANEL
ALT: 33 U/L (ref 0–35)
AST: 24 U/L (ref 0–37)
Albumin: 4.3 g/dL (ref 3.5–5.2)
Alkaline Phosphatase: 86 U/L (ref 39–117)
Bilirubin, Direct: 0 mg/dL (ref 0.0–0.3)
Total Bilirubin: 0.6 mg/dL (ref 0.2–1.2)
Total Protein: 7.1 g/dL (ref 6.0–8.3)

## 2023-02-28 LAB — CBC WITH DIFFERENTIAL/PLATELET
Basophils Absolute: 0 10*3/uL (ref 0.0–0.1)
Basophils Relative: 0.8 % (ref 0.0–3.0)
Eosinophils Absolute: 0.1 10*3/uL (ref 0.0–0.7)
Eosinophils Relative: 2.9 % (ref 0.0–5.0)
HCT: 42.6 % (ref 36.0–46.0)
Hemoglobin: 14.1 g/dL (ref 12.0–15.0)
Lymphocytes Relative: 40.8 % (ref 12.0–46.0)
Lymphs Abs: 1.8 10*3/uL (ref 0.7–4.0)
MCHC: 33.1 g/dL (ref 30.0–36.0)
MCV: 93.6 fL (ref 78.0–100.0)
Monocytes Absolute: 0.4 10*3/uL (ref 0.1–1.0)
Monocytes Relative: 8.6 % (ref 3.0–12.0)
Neutro Abs: 2 10*3/uL (ref 1.4–7.7)
Neutrophils Relative %: 46.9 % (ref 43.0–77.0)
Platelets: 310 10*3/uL (ref 150.0–400.0)
RBC: 4.56 Mil/uL (ref 3.87–5.11)
RDW: 13.1 % (ref 11.5–15.5)
WBC: 4.3 10*3/uL (ref 4.0–10.5)

## 2023-02-28 LAB — TSH: TSH: 2.4 u[IU]/mL (ref 0.35–5.50)

## 2023-02-28 LAB — HEMOGLOBIN A1C: Hgb A1c MFr Bld: 5.8 % (ref 4.6–6.5)

## 2023-02-28 LAB — VITAMIN B12: Vitamin B-12: 656 pg/mL (ref 211–911)

## 2023-03-06 ENCOUNTER — Other Ambulatory Visit: Payer: Self-pay

## 2023-03-06 MED ORDER — CIPROFLOXACIN HCL 500 MG PO TABS
500.0000 mg | ORAL_TABLET | Freq: Two times a day (BID) | ORAL | 0 refills | Status: AC
Start: 2023-03-06 — End: 2023-03-13

## 2023-03-06 NOTE — Telephone Encounter (Signed)
Pt called back to say she is going another route and will not be needing the Rx, after all. Thank you.

## 2023-03-12 ENCOUNTER — Encounter: Payer: Self-pay | Admitting: Gastroenterology

## 2023-04-22 ENCOUNTER — Ambulatory Visit (AMBULATORY_SURGERY_CENTER): Payer: BC Managed Care – PPO

## 2023-04-22 VITALS — Ht 66.0 in | Wt 136.0 lb

## 2023-04-22 DIAGNOSIS — Z1211 Encounter for screening for malignant neoplasm of colon: Secondary | ICD-10-CM

## 2023-04-22 MED ORDER — NA SULFATE-K SULFATE-MG SULF 17.5-3.13-1.6 GM/177ML PO SOLN: 1.0000 | Freq: Once | ORAL | 0 refills | Status: AC

## 2023-04-22 NOTE — Progress Notes (Signed)
Pre visit completed via phone call; Patient verified name, DOB, and address; No egg or soy allergy known to patient;  No issues known to pt with past sedation with any surgeries or procedures; Patient denies ever being told they had issues or difficulty with intubation;  No FH of Malignant Hyperthermia; Pt is not on diet pills; Pt is not on home 02;  Pt is not on blood thinners;  Pt denies issues with constipation;  No A fib or A flutter; Have any cardiac testing pending--NO Insurance verified during PV appt--- BCBS Pt can ambulate without assistance;  Pt denies use of chewing tobacco Discussed diabetic/weight loss medication holds; Discussed NSAID holds; Checked BMI to be less than 50; Pt instructed to use Single[employer] or GoodRx for a price reduction on prep  Patient's chart reviewed by Cathlyn Parsons CNRA prior to previsit and patient appropriate for the [facility].  Pre visit completed and red dot placed by patient's name on their procedure day (on provider's schedule).    Instructions sent to MyChart per patient request;

## 2023-05-06 ENCOUNTER — Ambulatory Visit: Payer: Self-pay | Admitting: Family Medicine

## 2023-05-06 ENCOUNTER — Ambulatory Visit (INDEPENDENT_AMBULATORY_CARE_PROVIDER_SITE_OTHER): Payer: BC Managed Care – PPO | Admitting: Physician Assistant

## 2023-05-06 ENCOUNTER — Ambulatory Visit (HOSPITAL_BASED_OUTPATIENT_CLINIC_OR_DEPARTMENT_OTHER)
Admission: RE | Admit: 2023-05-06 | Discharge: 2023-05-06 | Disposition: A | Discharge status: Home / Self Care | Payer: BC Managed Care – PPO | Source: Ambulatory Visit | Attending: Physician Assistant | Admitting: Physician Assistant

## 2023-05-06 ENCOUNTER — Encounter: Payer: Self-pay | Admitting: Physician Assistant

## 2023-05-06 VITALS — BP 130/82 | HR 75 | Temp 98.2°F | Ht 66.0 in | Wt 145.0 lb

## 2023-05-06 DIAGNOSIS — M25561 Pain in right knee: Secondary | ICD-10-CM | POA: Insufficient documentation

## 2023-05-06 NOTE — Telephone Encounter (Signed)
Noted  

## 2023-05-06 NOTE — Progress Notes (Signed)
Established patient visit   Patient: Brittany Bartlett   DOB: 02-25-1961   63 y.o. Female  MRN: [id number] Visit Date: 05/06/2023  Today's healthcare provider: Alfredia Ferguson, PA-C   Cc. Right knee pain  Subjective     Discussed the use of AI scribe software for clinical note transcription with the patient, who gave verbal consent to proceed.  History of Present Illness   [name] presents with a recent injury to her right knee. She reports hitting her right knee against the base of a banister this weekend, resulting in skin breakage and bleeding. The patient notes that the injury feels similar to a previous horizontal fracture of the same knee in 2017, which occurred after a fall on a sidewalk.She has been trying to keep her leg straight and has noticed some swelling around the knee. She is able to ambulate without significant pain.      Medications: Outpatient Medications Prior to Visit  Medication Sig   B Complex Vitamins (B COMPLEX 1 PO) B Complex   Collagen-Vitamin C-Biotin (COLLAGEN PO) Take by mouth daily.   Digestive Enzymes (DIGESTIVE ENZYME PO) in the morning and at bedtime.   OVER THE COUNTER MEDICATION Take 2 capsules by mouth daily. Whole food nutrient complex   OVER THE COUNTER MEDICATION Take 2 capsules by mouth daily. Essential Oil Omega Complex   OVER THE COUNTER MEDICATION BONE NUTRIENT   OVER THE COUNTER MEDICATION WOMENS PHYTOESTROGEN   OVER THE COUNTER MEDICATION ZENDOCRINE COMPLEX - ORGAN DETOX   OVER THE COUNTER MEDICATION daily as needed. ONGURAD SOFT GELS- IMMUNE SUPPORT   OVER THE COUNTER MEDICATION zINC AND HOLY bASIL BID   Probiotic Product (PROBIOTIC DAILY PO) Take 1-2 capsules by mouth daily. Takes once or twice daily   VITAMIN D, CHOLECALCIFEROL, PO Vitamin D   VITAMIN D-VITAMIN K PO Take by mouth.   No facility-administered medications prior to visit.    Review of Systems  Constitutional:  Negative for fatigue and fever.  Respiratory:  Negative  for cough and shortness of breath.   Cardiovascular:  Negative for chest pain and leg swelling.  Gastrointestinal:  Negative for abdominal pain.  Musculoskeletal:  Positive for arthralgias and joint swelling.  Neurological:  Negative for dizziness and headaches.       Objective    BP 130/82   Pulse 75   Temp 98.2 F (36.8 C) (Oral)   Ht 5\' 6"  (1.676 m)   Wt 145 lb (65.8 kg)   LMP 02/11/2011   SpO2 98%   BMI 23.40 kg/m    Physical Exam Vitals reviewed.  Constitutional:      Appearance: She is not ill-appearing.  HENT:     Head: Normocephalic.  Eyes:     Conjunctiva/sclera: Conjunctivae normal.  Cardiovascular:     Rate and Rhythm: Normal rate.  Pulmonary:     Effort: Pulmonary effort is normal. No respiratory distress.  Musculoskeletal:     Comments: Mild soft tissue swelling right knee, non bleeding abrasion.  No joint laxity, minimal tenderness  Neurological:     General: No focal deficit present.     Mental Status: She is alert and oriented to person, place, and time.  Psychiatric:        Mood and Affect: Mood normal.        Behavior: Behavior normal.     No results found for any visits on 05/06/23.  Assessment & Plan    Acute pain of right knee -  DG Knee Complete 4 Views Right; Future  Will get imaging right knee, unlikely fractured as pt is ambulating without difficulty, exam largely benign Recommending movement as tolerated, keeping elevated, ice if helpful.   Return if symptoms worsen or fail to improve.       Alfredia Ferguson, PA-C  [facility] [phone] (phone) [phone] (fax)  [facility]

## 2023-05-06 NOTE — Telephone Encounter (Signed)
Copied from CRM [id number]. Topic: Clinical - Pink Word Triage >> May 06, 2023  9:20 AM Samuel Jester B wrote: Reason for Triage: Pt stated that she hit her knee on Saturday on the banister it is now stiff and a bit swollen and feels she may need an x-ray,   Chief Complaint: right knee injury  Symptoms: swelling Frequency: ongoing since Saturday Pertinent Negatives: Patient denies pain Disposition: [] ED /[] Urgent Care (no appt availability in office) / [x] Appointment(In office/virtual)/ []  [facility]/ [] Home Care/ [] Refused Recommended Disposition /[] [facility]/ []  Follow-up with PCP Additional Notes: The patient fractured her knee cap in 2017 and the fracture was horizontal.  She did not experience any pain when it happened.  Saturday, she hit her knee in the same place on a banister.  It caused a small cut and bled but she is not in pain.  Since Saturday the swelling has increased, she said it is "puffy."  She is concerned that her knee is fractured and requested an x-ray.  She is able to put weight on her leg.  She was scheduled for a same day appointment for further assessment.  She was scheduled at a different office due to no availability in her normal pcp's office.      Reason for Disposition  [1] High-risk adult (e.g., age > 60 years, osteoporosis, chronic steroid use) AND [2] limping  Answer Assessment - Initial Assessment Questions 1. MECHANISM: "How did the injury happen?" (e.g., twisting injury, direct blow)      Direct blow  2. ONSET: "When did the injury happen?" (Minutes or hours ago)      Saturday  3. LOCATION: "Where is the injury located?"      Right knee  4. APPEARANCE of INJURY: "What does the injury look like?"      Red and slightly swollen  5. SEVERITY: "Can you put weight on that leg?" "Can you walk?"      Yes but is being cautious  6. SIZE: For cuts, bruises, or swelling, ask: "How large is it?" (e.g., inches or centimeters;  entire joint)       Small cut 7. PAIN: "Is there pain?" If Yes, ask: "How bad is the pain?"  "What does it keep you from doing?" (e.g., Scale 1-10; or mild, moderate, severe)   -  NONE: (0): no pain   -  MILD (1-3): doesn't interfere with normal activities    -  MODERATE (4-7): interferes with normal activities (e.g., work or school) or awakens from sleep, limping    -  SEVERE (8-10): excruciating pain, unable to do any normal activities, unable to walk     Minimal pain  9. OTHER SYMPTOMS: "Do you have any other symptoms?"  (e.g., "pop" when knee injured, swelling, locking, buckling)      Swelling  Skin broke and bled some Stiff  Protocols used: Knee Injury-A-AH

## 2023-05-08 ENCOUNTER — Encounter: Payer: Self-pay | Admitting: Physician Assistant

## 2023-05-09 ENCOUNTER — Encounter: Payer: Self-pay | Admitting: Gastroenterology

## 2023-05-16 ENCOUNTER — Encounter: Payer: Self-pay | Admitting: Gastroenterology

## 2023-05-16 ENCOUNTER — Ambulatory Visit: Payer: BC Managed Care – PPO | Admitting: Gastroenterology

## 2023-05-16 VITALS — BP 101/57 | HR 62 | Temp 97.3°F | Resp 8 | Ht 66.0 in | Wt 136.0 lb

## 2023-05-16 DIAGNOSIS — Z1211 Encounter for screening for malignant neoplasm of colon: Secondary | ICD-10-CM | POA: Diagnosis present

## 2023-05-16 DIAGNOSIS — D125 Benign neoplasm of sigmoid colon: Secondary | ICD-10-CM

## 2023-05-16 DIAGNOSIS — K635 Polyp of colon: Secondary | ICD-10-CM | POA: Diagnosis not present

## 2023-05-16 MED ORDER — SODIUM CHLORIDE 0.9 % IV SOLN: 500.0000 mL | Freq: Once | INTRAVENOUS | Status: DC

## 2023-05-16 NOTE — Op Note (Signed)
[facility] Patient Name: Brittany Bartlett Procedure Date: 05/16/2023 9:03 AM MRN: [id number] Endoscopist: Doristine Locks , MD, [id number] Age: 63 Referring MD:  Date of Birth: 04/23/1960 Gender: Female Account #: [id number] Procedure:                Colonoscopy Indications:              Screening for colorectal malignant neoplasm (last                            colonoscopy was >10 years ago)                           Last colonoscopy was 07/2009 and normal. Medicines:                Monitored Anesthesia Care Procedure:                Pre-Anesthesia Assessment:                           - Prior to the procedure, a History and Physical                            was performed, and patient medications and                            allergies were reviewed. The patient's tolerance of                            previous anesthesia was also reviewed. The risks                            and benefits of the procedure and the sedation                            options and risks were discussed with the patient.                            All questions were answered, and informed consent                            was obtained. Prior Anticoagulants: The patient has                            taken no anticoagulant or antiplatelet agents. ASA                            Grade Assessment: II - A patient with mild systemic                            disease. After reviewing the risks and benefits,                            the patient was deemed in satisfactory condition to  undergo the procedure.                           After obtaining informed consent, the colonoscope                            was passed under direct vision. Throughout the                            procedure, the patient's blood pressure, pulse, and                            oxygen saturations were monitored continuously. The                            Olympus Scope SN [id number] was  introduced through the                            anus and advanced to the the cecum, identified by                            appendiceal orifice and ileocecal valve. The                            colonoscopy was performed without difficulty. The                            patient tolerated the procedure well. The quality                            of the bowel preparation was excellent. The                            ileocecal valve, appendiceal orifice, and rectum                            were photographed. Scope In: 9:13:33 AM Scope Out: 9:27:39 AM Scope Withdrawal Time: 0 hours 10 minutes 54 seconds  Total Procedure Duration: 0 hours 14 minutes 6 seconds  Findings:                 The perianal and digital rectal examinations were                            normal.                           A 2 mm polyp was found in the distal sigmoid colon.                            The polyp was sessile. The polyp was removed with a                            cold biopsy forceps. Resection and retrieval were  complete. Estimated blood loss was minimal.                           The exam was otherwise normal throughout the                            remainder of the colon.                           The retroflexed view of the distal rectum and anal                            verge was normal and showed no anal or rectal                            abnormalities. Complications:            No immediate complications. Estimated Blood Loss:     Estimated blood loss was minimal. Impression:               - One 2 mm polyp in the distal sigmoid colon,                            removed with a cold biopsy forceps. Resected and                            retrieved.                           - The remainder of the colon was normal appearing.                           - The distal rectum and anal verge are normal on                            retroflexion view. Recommendation:            - Patient has a contact number available for                            emergencies. The signs and symptoms of potential                            delayed complications were discussed with the                            patient. Return to normal activities tomorrow.                            Written discharge instructions were provided to the                            patient.                           - Resume previous diet.                           -  Continue present medications.                           - Await pathology results.                           - Repeat colonoscopy in 7-10 years for surveillance                            based on pathology results.                           - Return to GI office PRN. Doristine Locks, MD 05/16/2023 9:31:21 AM

## 2023-05-16 NOTE — Progress Notes (Signed)
 Pt's states no medical or surgical changes since previsit or office visit.

## 2023-05-16 NOTE — Progress Notes (Signed)
GASTROENTEROLOGY PROCEDURE H&P NOTE   Primary Care Physician: Nelwyn Salisbury, MD    Reason for Procedure:  Colon Cancer screening  Plan:    Colonoscopy  Patient is appropriate for endoscopic procedure(s) in the ambulatory ([facility]) setting.  The nature of the procedure, as well as the risks, benefits, and alternatives were carefully and thoroughly reviewed with the patient. Ample time for discussion and questions allowed. The patient understood, was satisfied, and agreed to proceed.     HPI: Brittany Bartlett is a 63 y.o. female who presents for colonoscopy for routine Colon Cancer screening.  No active GI symptoms.  No known family history of colon cancer or related malignancy.  Patient is otherwise without complaints or active issues today.  Last colonoscopy was 07/2009 and normal.   Past Medical History:  Diagnosis Date   Anemia    history   Headache(784.0)    Migraine    SVD (spontaneous vaginal delivery)    x 1    Past Surgical History:  Procedure Laterality Date   BREAST SURGERY     marker in left breast - watching an area   COLONOSCOPY  2011   per Dr. Jarold Motto, clear. Cologuard negative June 2021     DIAGNOSTIC LAPAROSCOPY     x 2   DILATATION & CURRETTAGE/HYSTEROSCOPY WITH RESECTOCOPE N/A 06/25/2012   Procedure: DILATATION & CURETTAGE/HYSTEROSCOPY WITH RESECTOCOPE;  Surgeon: Freddrick March. Tenny Craw, MD;  Location: [facility];  Service: Gynecology;  Laterality: N/A;- NO D AND C PER PT- WITH THIS SURGERY    SHOULDER SURGERY     to remove bone spur 11-2007 Dr Shelle Iron   TONSILLECTOMY     WISDOM TOOTH EXTRACTION      Prior to Admission medications   Medication Sig Start Date End Date Taking? Authorizing Provider  B Complex Vitamins (B COMPLEX 1 PO) B Complex    [provider]  Collagen-Vitamin C-Biotin (COLLAGEN PO) Take by mouth daily.    [provider]  Digestive Enzymes (DIGESTIVE ENZYME PO) in the morning and at bedtime.    [provider]  OVER THE  COUNTER MEDICATION Take 2 capsules by mouth daily. Whole food nutrient complex    [provider]  OVER THE COUNTER MEDICATION Take 2 capsules by mouth daily. Essential Oil Omega Complex    [provider]  OVER THE COUNTER MEDICATION BONE NUTRIENT    [provider]  OVER THE COUNTER MEDICATION WOMENS PHYTOESTROGEN    [provider]  OVER THE COUNTER MEDICATION ZENDOCRINE COMPLEX - ORGAN DETOX    [provider]  OVER THE COUNTER MEDICATION daily as needed. ONGURAD SOFT GELS- IMMUNE SUPPORT    [provider]  OVER THE COUNTER MEDICATION zINC AND HOLY bASIL BID    [provider]  Probiotic Product (PROBIOTIC DAILY PO) Take 1-2 capsules by mouth daily. Takes once or twice daily    [provider]  VITAMIN D, CHOLECALCIFEROL, PO Vitamin D    [provider]    Current Outpatient Medications  Medication Sig Dispense Refill   B Complex Vitamins (B COMPLEX 1 PO) B Complex     Collagen-Vitamin C-Biotin (COLLAGEN PO) Take by mouth daily.     Digestive Enzymes (DIGESTIVE ENZYME PO) in the morning and at bedtime.     OVER THE COUNTER MEDICATION Take 2 capsules by mouth daily. Whole food nutrient complex     OVER THE COUNTER MEDICATION Take 2 capsules by mouth daily. Essential Oil Omega Complex  OVER THE COUNTER MEDICATION BONE NUTRIENT     OVER THE COUNTER MEDICATION WOMENS PHYTOESTROGEN     OVER THE COUNTER MEDICATION ZENDOCRINE COMPLEX - ORGAN DETOX     OVER THE COUNTER MEDICATION daily as needed. ONGURAD SOFT GELS- IMMUNE SUPPORT     OVER THE COUNTER MEDICATION zINC AND HOLY bASIL BID     Probiotic Product (PROBIOTIC DAILY PO) Take 1-2 capsules by mouth daily. Takes once or twice daily     VITAMIN D, CHOLECALCIFEROL, PO Vitamin D     Current Facility-Administered Medications  Medication Dose Route Frequency Provider Last Rate Last Admin   0.9 %  sodium chloride infusion  500 mL Intravenous Once Chaun Uemura,  Khamiya Varin V, DO        Allergies as of 05/16/2023 - Review Complete 05/16/2023  Allergen Reaction Noted   Sulfonamide derivatives Hives, Itching, and Rash     Family History  Problem Relation Age of Onset   Heart disease Father    Leukemia Paternal Uncle    Stomach cancer Paternal Grandmother 20   Migraines Other        family hx   Hyperlipidemia Other        family hx   Hypertension Other        family hx   Prostate cancer Other        family hx 1st degree <50   Colon polyps Neg Hx    Colon cancer Neg Hx    Esophageal cancer Neg Hx    Rectal cancer Neg Hx     Social History   Socioeconomic History   Marital status: Married    Spouse name: Not on file   Number of children: Not on file   Years of education: Not on file   Highest education level: Not on file  Occupational History   Not on file  Tobacco Use   Smoking status: Never   Smokeless tobacco: Never  Vaping Use   Vaping status: Never Used  Substance and Sexual Activity   Alcohol use: Yes    Alcohol/week: 0.0 standard drinks of alcohol    Comment: occ   Drug use: No   Sexual activity: Yes    Birth control/protection: None  Other Topics Concern   Not on file  Social History Narrative   Not on file   Social Drivers of Health   Financial Resource Strain: Not on file  Food Insecurity: Not on file  Transportation Needs: Not on file  Physical Activity: Not on file  Stress: Not on file  Social Connections: Not on file  Intimate Partner Violence: Not on file    Physical Exam: Vital signs in last 24 hours: @BP  100/64   Pulse 84   Temp (!) 97.3 F (36.3 C) (Temporal)   Ht 5\' 6"  (1.676 m)   Wt 136 lb (61.7 kg)   LMP 02/11/2011   SpO2 100%   BMI 21.95 kg/m  GEN: NAD EYE: Sclerae anicteric ENT: MMM CV: Non-tachycardic Pulm: CTA b/l GI: Soft, NT/ND NEURO:  Alert & Oriented x 3   Doristine Locks, DO [facility]   05/16/2023 9:06 AM

## 2023-05-16 NOTE — Progress Notes (Signed)
 Called to room to assist during endoscopic procedure.  Patient ID and intended procedure confirmed with present staff. Received instructions for my participation in the procedure from the performing physician.

## 2023-05-16 NOTE — Patient Instructions (Signed)
 -  Resume previous diet. - Continue present medications. - Await pathology results. - Patient given educational handouts related to procedure.   YOU HAD AN ENDOSCOPIC PROCEDURE TODAY AT THE [facility]:   Refer to the procedure report that was given to you for any specific questions about what was found during the examination.  If the procedure report does not answer your questions, please call your gastroenterologist to clarify.  If you requested that your care partner not be given the details of your procedure findings, then the procedure report has been included in a sealed envelope for you to review at your convenience later.  YOU SHOULD EXPECT: Some feelings of bloating in the abdomen. Passage of more gas than usual.  Walking can help get rid of the air that was put into your GI tract during the procedure and reduce the bloating. If you had a lower endoscopy (such as a colonoscopy or flexible sigmoidoscopy) you may notice spotting of blood in your stool or on the toilet paper. If you underwent a bowel prep for your procedure, you may not have a normal bowel movement for a few days.  Please Note:  You might notice some irritation and congestion in your nose or some drainage.  This is from the oxygen used during your procedure.  There is no need for concern and it should clear up in a day or so.  SYMPTOMS TO REPORT IMMEDIATELY:  Following lower endoscopy (colonoscopy or flexible sigmoidoscopy):  Excessive amounts of blood in the stool  Significant tenderness or worsening of abdominal pains  Swelling of the abdomen that is new, acute  Fever of 100F or higher  For urgent or emergent issues, a gastroenterologist can be reached at any hour by calling [phone]. Do not use MyChart messaging for urgent concerns.    DIET:  We do recommend a small meal at first, but then you may proceed to your regular diet.  Drink plenty of fluids but you should avoid alcoholic beverages for 24  hours.  ACTIVITY:  You should plan to take it easy for the rest of today and you should NOT DRIVE or use heavy machinery until tomorrow (because of the sedation medicines used during the test).    FOLLOW UP: Our staff will call the number listed on your records the next business day following your procedure.  We will call around 7:15- 8:00 am to check on you and address any questions or concerns that you may have regarding the information given to you following your procedure. If we do not reach you, we will leave a message.     If any biopsies were taken you will be contacted by phone or by letter within the next 1-3 weeks.  Please call us at [phone] if you have not heard about the biopsies in 3 weeks.    SIGNATURES/CONFIDENTIALITY: You and/or your care partner have signed paperwork which will be entered into your electronic medical record.  These signatures attest to the fact that that the information above on your After Visit Summary has been reviewed and is understood.  Full responsibility of the confidentiality of this discharge information lies with you and/or your care-partner.

## 2023-05-16 NOTE — Progress Notes (Signed)
 Vss nad trans to pacu

## 2023-05-19 ENCOUNTER — Telehealth: Payer: Self-pay

## 2023-05-19 NOTE — Telephone Encounter (Signed)
  Follow up Call-     05/16/2023    8:19 AM  Call back number  Post procedure Call Back phone  # [phone]  Permission to leave phone message Yes    Attempted to call patient regarding follow-up from colonoscopy, no answer. VM left.

## 2023-05-20 LAB — SURGICAL PATHOLOGY

## 2023-05-22 ENCOUNTER — Encounter: Payer: Self-pay | Admitting: Gastroenterology

## 2023-10-14 ENCOUNTER — Other Ambulatory Visit: Payer: Self-pay | Admitting: Obstetrics and Gynecology

## 2023-10-14 DIAGNOSIS — Z1231 Encounter for screening mammogram for malignant neoplasm of breast: Secondary | ICD-10-CM

## 2023-10-21 ENCOUNTER — Ambulatory Visit
Admission: RE | Admit: 2023-10-21 | Discharge: 2023-10-21 | Disposition: A | Discharge status: Home / Self Care | Source: Ambulatory Visit

## 2023-10-21 DIAGNOSIS — Z1231 Encounter for screening mammogram for malignant neoplasm of breast: Secondary | ICD-10-CM
